# Patient Record
Sex: Female | Born: 1970 | Race: Black or African American | Hispanic: No | Marital: Married | Smoking: Never smoker
Health system: Southern US, Community
[De-identification: ages and names within clinical notes are randomized; demographics above are authoritative.]

## PROBLEM LIST (undated history)

## (undated) DIAGNOSIS — K219 Gastro-esophageal reflux disease without esophagitis: Secondary | ICD-10-CM

## (undated) DIAGNOSIS — K921 Melena: Secondary | ICD-10-CM

## (undated) DIAGNOSIS — M1990 Unspecified osteoarthritis, unspecified site: Secondary | ICD-10-CM

---

## 1999-02-25 ENCOUNTER — Other Ambulatory Visit: Admission: RE | Admit: 1999-02-25 | Discharge: 1999-02-25 | Payer: Self-pay | Admitting: *Deleted

## 2001-11-01 ENCOUNTER — Other Ambulatory Visit: Admission: RE | Admit: 2001-11-01 | Discharge: 2001-11-01 | Payer: Self-pay | Admitting: Obstetrics and Gynecology

## 2002-04-03 ENCOUNTER — Encounter (INDEPENDENT_AMBULATORY_CARE_PROVIDER_SITE_OTHER): Payer: Self-pay | Admitting: Specialist

## 2002-04-03 ENCOUNTER — Inpatient Hospital Stay (HOSPITAL_COMMUNITY): Admission: AD | Admit: 2002-04-03 | Discharge: 2002-04-08 | Payer: Self-pay | Admitting: Obstetrics and Gynecology

## 2002-05-13 ENCOUNTER — Other Ambulatory Visit: Admission: RE | Admit: 2002-05-13 | Discharge: 2002-05-13 | Payer: Self-pay | Admitting: Obstetrics and Gynecology

## 2003-06-06 ENCOUNTER — Other Ambulatory Visit: Admission: RE | Admit: 2003-06-06 | Discharge: 2003-06-06 | Payer: Self-pay | Admitting: Obstetrics and Gynecology

## 2005-07-13 ENCOUNTER — Encounter: Admission: RE | Admit: 2005-07-13 | Discharge: 2005-07-13 | Payer: Self-pay | Admitting: Family Medicine

## 2006-11-15 ENCOUNTER — Inpatient Hospital Stay (HOSPITAL_COMMUNITY): Admission: AD | Admit: 2006-11-15 | Discharge: 2006-11-18 | Payer: Self-pay | Admitting: Obstetrics and Gynecology

## 2009-01-24 ENCOUNTER — Emergency Department (HOSPITAL_COMMUNITY): Admission: EM | Admit: 2009-01-24 | Discharge: 2009-01-24 | Payer: Self-pay | Admitting: Family Medicine

## 2011-01-03 ENCOUNTER — Other Ambulatory Visit: Payer: Self-pay | Admitting: Obstetrics and Gynecology

## 2011-01-03 DIAGNOSIS — Z1231 Encounter for screening mammogram for malignant neoplasm of breast: Secondary | ICD-10-CM

## 2011-01-11 ENCOUNTER — Ambulatory Visit
Admission: RE | Admit: 2011-01-11 | Discharge: 2011-01-11 | Disposition: A | Discharge status: Home / Self Care | Payer: Managed Care, Other (non HMO) | Source: Ambulatory Visit | Attending: Obstetrics and Gynecology | Admitting: Obstetrics and Gynecology

## 2011-01-11 DIAGNOSIS — Z1231 Encounter for screening mammogram for malignant neoplasm of breast: Secondary | ICD-10-CM

## 2011-01-26 LAB — POCT I-STAT, CHEM 8
BUN: 3 mg/dL — ABNORMAL LOW (ref 6–23)
Calcium, Ion: 1.1 mmol/L — ABNORMAL LOW (ref 1.12–1.32)
Hemoglobin: 13.6 g/dL (ref 12.0–15.0)
Sodium: 140 mEq/L (ref 135–145)
TCO2: 28 mmol/L (ref 0–100)

## 2011-03-04 NOTE — Discharge Summary (Signed)
Katrina Oconnor, Katrina Oconnor                  ACCOUNT NO.:  [id number]   MEDICAL RECORD NO.:  [id number]          PATIENT TYPE:  INP   LOCATION:  9142                          FACILITY:  [facility]   PHYSICIAN:  Maxie Better, M.D.DATE OF BIRTH:  1971/04/23   DATE OF ADMISSION:  11/15/2006  DATE OF DISCHARGE:  11/18/2006                               DISCHARGE SUMMARY   ADMISSION DIAGNOSES:  1. Previous cesarean section.  2. Term gestation.   DIAGNOSES:  1. Term gestation delivered.  2. Previous cesarean section.   PROCEDURES:  1. Repeat cesarean section.  2. Extensive lysis of adhesions.   HOSPITAL COURSE:  The patient was admitted to [facility] She was  taken to the operating room, where she underwent a repeat cesarean  section and extensive lysis of adhesions.  The procedure resulted in  delivery of a live female; 7 pounds and Apgars of 8 and 9.  The patient  had an uncomplicated postoperative course.  By postoperative day #3 she  had a bowel movement, passed flatus and tolerating a regular diet.  Her  incision had no erythema, induration or exudate.  She was deemed well to  be discharged home.   Her CBC on postoperative day #1 showed a hemoglobin 9.6, hematocrit 28,  white count 8.9, platelet count 179,000.   DISPOSITION:  Home.   DISCHARGE CONDITION:  Stable.   DISCHARGE MEDICATIONS:  1. Prenatal vitamins one p.o. daily.  2. Percocet 1-2 tablets q.4-6 h p.r.n. pain.  3. Niferex Forte one p.o. daily.  4. Ibuprofen 800 mg q.8 h p.r.n. pain.   DISCHARGE INSTRUCTIONS:  Per the postpartum booklet given.  Discharge  follow-up appointment at [facility] on November 22, 2006 with staple  removal, and then 6 weeks postpartum.     Maxie Better, M.D.  Electronically Signed    [place]/MEDQ  D:  12/10/2006  T:  12/10/2006  Job:  [id number]

## 2011-03-04 NOTE — H&P (Signed)
[facility]  Patient:    Katrina Oconnor, Katrina Oconnor Visit Number: [id number] MRN: [id number]          Service Type: OBS Location: 910A 9112 01 Attending Physician:  Maxie Better Dictated by:   Sheria Lang. Cherly Hensen, M.D. Admit Date:  04/03/2002                           History and Physical  DATE OF BIRTH:                1971/05/13  CHIEF COMPLAINT:              Oligohydramnios, induction of labor.  HISTORY OF PRESENT ILLNESS:   This is a 41 year old gravida 1 para 0 married black female with an EDC of April 11, 2002 by ultrasound who is now at 42 and six-sevenths weeks gestation admitted for cervical ripening and induction of labor secondary to oligohydramnios.  The patient underwent an ultrasound for size greater than dates which revealed estimated fetal weight of 7 pounds 12 ounces and an amniotic fluid index of 5 which is at the second percentile. The patient has an unfavorable cervix.  Her cervix was long, closed, and presenting part -3.  The patient denies any contractions.  Her prenatal course has been uncomplicated.  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  Prenatal care is at [facility], primary obstetrician Dr. Maxie Better.  PRENATAL LABORATORY DATA:     Blood type O positive, antibody screen negative. Hemoglobin electrophoresis is normal.  RPR is nonreactive.  Rubella is equivocal.  Hepatitis B surface antigen is negative.  HIV test was negative. GC and chlamydia cultures were negative.  AFP3 test was normal.  Ultrasound on October 22, 2001 showed 15.4 weeks with her EDC changed to April 11, 2002.  The patient had a normal anatomic fetal survey on December 12, 2001 at which time she was 23.2 weeks.  One-hour GCT was normal.  Hemoglobin at that time was 10.1 - this was on January 09, 2002.  Group B strep culture  was negative.  PAST MEDICAL HISTORY:  ALLERGIES:                    No known drug allergies.  MEDICATIONS:                  Iron supplements, prenatal vitamins, Valtrex.  MEDICAL HISTORY:              Childhood asthma.  SURGERY:                      None.  FAMILY HISTORY:               Hypertension - mother and maternal grandmother.  SOCIAL HISTORY:               Married, nonsmoker.  Works as a Engineer, mining at [employer].  REVIEW OF SYSTEMS:            Negative except as noted in the history of present illness.  PHYSICAL EXAMINATION:  GENERAL:                      Anxious, gravid black female.  VITAL SIGNS:  Blood pressure 118/80, fetal heart rate 152, weight 182 pounds.  SKIN:                         Shows no lesions.  HEENT:                        Anicteric sclerae, pale conjunctivae. Oropharynx negative.  HEART:                        Regular rate and rhythm without murmur.  LUNGS:                        Clear to auscultation.  BREASTS:                      Soft, pedunculated, no palpable mass.  ABDOMEN:                      Gravid.  Fundal height 42 cm.  PELVIC:                       Cervix was long, closed, presenting part high.  EXTREMITIES:                  Show 1-2+ edema.  IMPRESSION:                   1. Term gestation.                               2. Oligohydramnios.                               3. History of herpes simplex virus without any                                  recent outbreak.                               4. Rubella equivocal.  PLAN:                         1. Admission.                               2. Routine labs.                               3. Cervical ripening with Cervidil.                               4. Pitocin induction on April 04, 2002.                               5. Rubella vaccine postpartum. Dictated by:   Sheria Lang. Cherly Hensen, M.D. Attending Physician:  Maxie Better DD:  04/03/02 TD:   04/04/02 Job: [id number]

## 2011-03-04 NOTE — Discharge Summary (Signed)
[facility]  Patient:    Katrina Oconnor, Katrina Oconnor Visit Number: [id number] MRN: [id number]          Service Type: OBS Location: 910A 9112 01 Attending Physician:  Maxie Better Dictated by:   Sheria Lang. Cherly Hensen, M.D. Admit Date:  04/03/2002 Discharge Date: 04/08/2002                             Discharge Summary  ADMISSION DIAGNOSES:          1. Oligohydramnios.                               2. Term gestation.                               3. Rubella nonimmune.  DISCHARGE DIAGNOSES:          1. Term gestation, delivered.                               2. Arrest of dilatation.                               3. Mild chorioamnionitis.                               4. Postoperative anemia.                               5. Status post primary cesarean section.  PROCEDURES:                   Primary cesarean section.  HISTORY OF PRESENT ILLNESS:   This is a 40 year old gravida 1, para 0 female at 38+ weeks gestation, admitted for cervical ripening secondary to severe oligohydramnios.  The patient had an uncomfortable prenatal course.  HOSPITAL COURSE:              The patient was admitted.  She was placed on a monitor.  She had a reactive non-stress test.  Cervidil was placed for ripening.  She subsequently had Pitocin induction and artificial rupture of membranes.  Her group B strep culture was negative.  She received an epidural in active labor.  Intrauterine pressure catheter and internal fetal scalp electrodes were placed.  The patient progressed to 6 cm, and ______ and anterior lip that was edematous, +1 station with overriding palpable sutures. She remained unchanged at that cervical dilatation.  She spiked a temperature to 101.9 axillary.  Presumptive diagnosis of chorioamnionitis was made.  She was started on gentamicin and clindamycin.  At that point she had been ruptured of membranes for 17 hours.  The decision was made to proceed with cesarean  section.  She underwent a primary low transverse uterine incision with resultant delivery of a live female, 7 pounds 5 ounces, Apgars of 8 and 9. She was continued on her antibiotics postoperatively.  After being afebrile for at least 48 hours, the patients antibiotics were discontinued.  She was tolerating a regular diet, passing flatus.  By postoperative day #3 she was deemed well enough to be discharged.  Her incision had no erythema,  induration, or exudate.  Her CBC on postoperative day #1 showed a hemoglobin of 8.9, white count of 18.5, platelet count of 151,000.  She was given rubella vaccine postpartum.  She was discharged home.  DISPOSITION:                  Home.  CONDITION ON DISCHARGE:       Stable.  DISCHARGE MEDICATIONS:        1. Tylox #30, 1 p.o. q.6-8h. p.r.n. pain.                               2. Motrin 600 mg 1 p.o. q.6h. p.r.n. pain.                               3. Prenatal vitamins 1 p.o. q.d.                               4. Over-the-counter iron supplementation 1 p.o.                                  b.i.d.  FOLLOW-UP:                    Follow-up appointment at four weeks postpartum.  DISCHARGE INSTRUCTIONS:       Call for temperature greater than or equal to 100.4.  Nothing per vagina for four to six weeks.  Call if severe abdominal pain, nausea, vomiting, or incisional pain, redness, or drainage from the incision site, passage of clots, soaking a regular pad every hour or more frequently. Dictated by:   Sheria Lang. Cherly Hensen, M.D. Attending Physician:  Maxie Better DD:  05/01/02 TD:  05/06/02 Job: [id number]

## 2011-03-04 NOTE — Op Note (Signed)
[facility]  Patient:    Katrina Oconnor, Katrina Oconnor Visit Number: [id number] MRN: [id number]          Service Type: OBS Location: 910A 9112 01 Attending Physician:  Maxie Better Dictated by:   Sheria Lang. Cherly Hensen, M.D. Proc. Date: 04/05/02 Admit Date:  04/03/2002                             Operative Report  PREOPERATIVE DIAGNOSES:       1. Arrest of dilatation.                               2. Presumed chorionitis.                               3. Severe oligohydramnios.  POSTOPERATIVE DIAGNOSES:      1. Arrest of dilatation.                               2. Presumed chorionitis.  OPERATION:                    Primary cesarean section.  SURGEON:                      Sheronette A. Cherly Hensen, M.D.  ANESTHESIA:                   Epidural.  INDICATIONS:                  This is a 40 year old gravida 1, para 0 female at 38+ weeks gestation who had been admitted on April 03, 2002, secondary to severe oligohydramnios and for induction of labor. The patient underwent cervical ripening with Cervidil followed by Pitocin and she subsequently had artificial rupture of membranes, clear fluid noted on the following day. Intrauterine pressure catheter was placed. Amnioinfusion was prophylactically done. Her group B strep culture was negative. Pitocin induction was continued. The patient progressed to 6 cm, +1 station, with overriding sutures noted and an anterior swollen cervix and despite continued Pitocin over a 74-hour period of time, cervical exam remained unchanged. The decision was, therefore, made to proceed with a primary cesarean section. At that point, her temperature was taken and it was noted to be 101.9 axillary and she was started on gentamicin clindamycin for presumed chorionitis. Her prenatal course had otherwise been unremarkable. Risks and benefits of procedure had been explained to the patient and her family, consent was obtained and signed, and  the patient was transferred to the operating room.  DESCRIPTION OF PROCEDURE:     Under adequate epidural anesthesia, the patient was placed in the supine position with left lateral tilt. Indwelling Foley catheter had been in place previously. She was sterilely prepped and draped in the usual fashion. A marking pen was utilized to outline the planned incision. Marcaine 0.25% 8 cc was injected along the planned incision line. A scalpel was then used to make the Pfannenstiel incision. Cautery was then utilized to carry it down to the rectus fascia. The rectus fascia was incised in the midline and extended bilaterally. The rectus fascia was then bluntly and with cautery dissected down to the rectus muscle in superior and inferior fashion. The rectus muscles split in the midline. The  parietal peritoneum was entered bluntly and extended. The vesicouterine peritoneum was then created. The bladder, which was loosely attached to the lower uterine segment, was bluntly dissected and displaced from the operative field using a bladder retractor. A curvilinear low transverse uterine incision was then made and carried bilaterally using bandage scissors. A copious amount of fluid was then noted from the amnioinfusion fluid. The baby was a live female who was delivered from a left occiput anterior position with a large caput. The baby was bulb suctioned in the abdomen, delivered, cord was clamped and cut. The baby was transferred to the awaiting pediatricians with sound Apgars of 8 and 9. The placenta was spontaneous, intact, and sent to pathology. The uterine cavity was cleaned of debris. The uterine incision was inspected, no extension noted. The uterine incision was closed. First layer with 0 Monocryl running-locked stitch. Second layer is imbricating using 0 Monocryl. The bleeding site on the right inferior aspect of the lower uterine segment was hemostased with 0 Vicryl figure-of-eight suture. Small  bleeding along the peritoneal edges were cauterized. The abdomen was then copiously irrigated. The paracolic gutters were cleaned of debris. Reinspection of the incision site showed good hemostasis. Normal tubes and ovaries were noted bilaterally. The vesicouterine peritoneum and the parietal peritoneum were now closed. Down the surface of the rectus fascia was inspected. No bleeding was noted. The rectus fascia was closed with 0 Vicryl x2. The subcuticular area was irrigated, small bleeders cauterized, and the skin approximated using Ethicon staples. Specimens, placenta sent to pathology. Fetal weight was 7 pounds 5 ounces. Estimated blood loss was 800 cc. Intraoperative fluid was three liters. Urine output was 50 cc of concentrated urine. Sponge, instrument, and needle counts x2 were correct. Complications none. The patient tolerated the procedure well, was transferred to the recovery room in stable condition. Dictated by:   Sheria Lang. Cherly Hensen, M.D. Attending Physician:  Maxie Better DD:  04/05/02 TD:  04/06/02 Job: [id number]

## 2011-03-04 NOTE — Op Note (Signed)
Katrina Oconnor, Katrina Oconnor                  ACCOUNT NO.:  [id number]   MEDICAL RECORD NO.:  [id number]          PATIENT TYPE:  INP   LOCATION:  9142                          FACILITY:  [facility]   PHYSICIAN:  Maxie Better, M.D.DATE OF BIRTH:  11/26/1970   DATE OF PROCEDURE:  11/15/2006  DATE OF DISCHARGE:                               OPERATIVE REPORT   PREOPERATIVE DIAGNOSES:  1. Previous cesarean section.  2. Term gestation.   PROCEDURES:  1. Repeat cesarean section, Kerr hysterotomy.  2. Extensive lysis of adhesions.   POSTOPERATIVE DIAGNOSES:  1. Previous cesarean section.  2. Term gestation.  3. Abdominal-pelvic adhesions, extensive.   ANESTHESIA:  Spinal.   SURGEON:  Maxie Better, M.D.   ASSISTANT:  Marlinda Mike, C.N.M.   PROCEDURE:  Under adequate spinal anesthesia, the patient was placed in  a supine position with a left lateral tilt.  She was sterilely prepped  and draped in the usual fashion.  Indwelling Foley catheter was  sterilely placed.  Marcaine 0.25% was injected along the previous  Pfannenstiel skin incision.  A Pfannenstiel skin incision was then made,  carried down to the rectus fascia using cautery.  The rectus fascia was  opened transversely.  The rectus fascia inferiorly was bluntly and  sharply dissected off the rectus muscle superiorly.  There were marked  adhesions and careful dissection was then performed.  The parietal  peritoneum was entered and on entering the parietal peritoneum, omentum  was noted.  Ultimately the parietal peritoneum was opened; however,  there were still adhesions of the omentum to the anterior abdominal  wall.  Part of that was lysed in order to facilitate the surgery.  Finally the vesicouterine peritoneum was visualized.  The bladder was  adherent to the lower uterine segment.  A transverse incision was then  made.  The attempt at dissection of the bladder inferiorly was not  successful.  A curvilinear low transverse  uterine incision was made and  a marked amount of bleeding was then noted from vessels.  Ultimately the  incision was able to be opened transversely.  The artificial rupture of  membranes.  Copious fluid was noted.  Subsequent delivery of a live  female using the vacuum  was performed.  The baby was bulb suctioned on  the abdomen.  Cord was clamped, cut.  The baby was transferred to the  awaiting pediatricians, who assigned Apgars of 8 and 9 at one and five  minutes.  The placenta was spontaneous, intact and was sent to  pathology.  The uterine cavity was cleaned of debris.  The uterine  incision had no extension, but there were still vessels that were  bleeding.  The uterine incision was closed in two layers, the first  layer of 0 Monocryl running locked stitch, second layer was imbricated  using 0 Monocryl suture.   At that time attention was then turned to the adhesions, which was the  omentum attached to the anterior abdominal wall and to the upper aspect  of the midportion of the uterus.  These adhesions were then carefully  lysed.  It appeared that the omentum was looped on itself, which needed  to be taken down in order to prevent any bowel obstructions in the  future.  After marked extensive adhesion release of the omentum off the  anterior abdominal wall, the end of the omentum was ratty and bleeding.  This resulted in a piece of those areas of the omentum to be clamped,  cut and free tied with 0 Vicryl and the remaining pieces removed.  The  upper abdomen was then finally able to be seen.  The tubes and ovaries  were noted to be normal.  Copious irrigation of the abdomen was then  performed, small bleeders again cauterized.  When good hemostasis  finally noted, Interceed was placed over the raw surface of the anterior  aspect of the uterus.  The parietal peritoneum was not closed.  Bleeding  on the rectus muscle was cauterized.  The rectus fascia was then closed  with 0 Vicryl  x2.  The subcutaneous area had bleeders, which was also  cauterized, and the due to the scarring was undermined with the cautery.  The subcutaneous area was irrigated, suctioned, small bleeders  cauterized.  Interrupted 2-0 plain sutures were then placed.  The skin  after much attempt was finally able to be closed with staples.   SPECIMENS:  Placenta, not sent to pathology.   ESTIMATED BLOOD LOSS:  1000 mL.   INTRAOPERATIVE FLUID:  4200 mL crystalloid.   URINE OUTPUT:  200 mL concentrated urine.   SPONGE AND SENT COUNTS X2:  Correct.   COMPLICATION:  None.   Weight of the baby was 7 pounds.  The patient tolerated the procedure  well, was transferred to recovery in stable condition.      Maxie Better, M.D.  Electronically Signed     [place]/MEDQ  D:  11/15/2006  T:  11/15/2006  Job:  [id number]

## 2011-12-21 ENCOUNTER — Other Ambulatory Visit: Payer: Self-pay | Admitting: Obstetrics and Gynecology

## 2011-12-21 DIAGNOSIS — Z1231 Encounter for screening mammogram for malignant neoplasm of breast: Secondary | ICD-10-CM

## 2012-01-24 ENCOUNTER — Ambulatory Visit
Admission: RE | Admit: 2012-01-24 | Discharge: 2012-01-24 | Disposition: A | Discharge status: Home / Self Care | Payer: Managed Care, Other (non HMO) | Source: Ambulatory Visit | Attending: Obstetrics and Gynecology | Admitting: Obstetrics and Gynecology

## 2012-01-24 DIAGNOSIS — Z1231 Encounter for screening mammogram for malignant neoplasm of breast: Secondary | ICD-10-CM

## 2013-01-16 ENCOUNTER — Other Ambulatory Visit: Payer: Self-pay

## 2013-01-16 DIAGNOSIS — Z1231 Encounter for screening mammogram for malignant neoplasm of breast: Secondary | ICD-10-CM

## 2013-02-25 ENCOUNTER — Ambulatory Visit
Admission: RE | Admit: 2013-02-25 | Discharge: 2013-02-25 | Disposition: A | Discharge status: Home / Self Care | Payer: Commercial Indemnity | Source: Ambulatory Visit

## 2013-02-25 DIAGNOSIS — Z1231 Encounter for screening mammogram for malignant neoplasm of breast: Secondary | ICD-10-CM

## 2013-12-11 ENCOUNTER — Emergency Department (HOSPITAL_COMMUNITY): Payer: Managed Care, Other (non HMO)

## 2013-12-11 ENCOUNTER — Emergency Department (HOSPITAL_COMMUNITY)
Admission: EM | Admit: 2013-12-11 | Discharge: 2013-12-11 | Disposition: A | Discharge status: Home / Self Care | Payer: Managed Care, Other (non HMO) | Attending: Emergency Medicine | Admitting: Emergency Medicine

## 2013-12-11 ENCOUNTER — Encounter (HOSPITAL_COMMUNITY): Payer: Self-pay | Admitting: Emergency Medicine

## 2013-12-11 DIAGNOSIS — M5430 Sciatica, unspecified side: Secondary | ICD-10-CM | POA: Insufficient documentation

## 2013-12-11 DIAGNOSIS — Z880 Allergy status to penicillin: Secondary | ICD-10-CM | POA: Insufficient documentation

## 2013-12-11 DIAGNOSIS — Z79899 Other long term (current) drug therapy: Secondary | ICD-10-CM | POA: Insufficient documentation

## 2013-12-11 DIAGNOSIS — R55 Syncope and collapse: Secondary | ICD-10-CM | POA: Insufficient documentation

## 2013-12-11 DIAGNOSIS — IMO0002 Reserved for concepts with insufficient information to code with codable children: Secondary | ICD-10-CM | POA: Insufficient documentation

## 2013-12-11 DIAGNOSIS — M5432 Sciatica, left side: Secondary | ICD-10-CM

## 2013-12-11 MED ORDER — DIAZEPAM 5 MG PO TABS
5.0000 mg | ORAL_TABLET | Freq: Once | ORAL | Status: AC
  Administered 2013-12-11: 5 mg via ORAL
  Filled 2013-12-11: qty 1

## 2013-12-11 MED ORDER — DIAZEPAM 5 MG PO TABS: 5.0000 mg | ORAL_TABLET | Freq: Two times a day (BID) | ORAL | Status: DC

## 2013-12-11 MED ORDER — PREDNISONE 20 MG PO TABS: ORAL_TABLET | ORAL | Status: DC

## 2013-12-11 MED ORDER — OXYCODONE-ACETAMINOPHEN 5-325 MG PO TABS
2.0000 | ORAL_TABLET | Freq: Once | ORAL | Status: AC
  Administered 2013-12-11: 2 via ORAL
  Filled 2013-12-11: qty 2

## 2013-12-11 MED ORDER — KETOROLAC TROMETHAMINE 60 MG/2ML IM SOLN: 60.0000 mg | Freq: Once | INTRAMUSCULAR | Status: DC

## 2013-12-11 MED ORDER — HYDROCODONE-ACETAMINOPHEN 5-325 MG PO TABS: 1.0000 | ORAL_TABLET | ORAL | Status: DC | PRN

## 2013-12-11 NOTE — Discharge Instructions (Signed)
Take prednisone as prescribed. Take Valium as needed as directed for muscle spasm. No driving or operating heavy machinery while taking valium. This medication may cause drowsiness. Take Vicodin for severe pain only. No driving or operating heavy machinery while taking vicodin. This medication may cause drowsiness. Follow up with your orthopedist.  Sciatica Sciatica is pain, weakness, numbness, or tingling along the path of the sciatic nerve. The nerve starts in the lower back and runs down the back of each leg. The nerve controls the muscles in the lower leg and in the back of the knee, while also providing sensation to the back of the thigh, lower leg, and the sole of your foot. Sciatica is a symptom of another medical condition. For instance, nerve damage or certain conditions, such as a herniated disk or bone spur on the spine, pinch or put pressure on the sciatic nerve. This causes the pain, weakness, or other sensations normally associated with sciatica. Generally, sciatica only affects one side of the body. CAUSES   Herniated or slipped disc.  Degenerative disk disease.  A pain disorder involving the narrow muscle in the buttocks (piriformis syndrome).  Pelvic injury or fracture.  Pregnancy.  Tumor (rare). SYMPTOMS  Symptoms can vary from mild to very severe. The symptoms usually travel from the low back to the buttocks and down the back of the leg. Symptoms can include:  Mild tingling or dull aches in the lower back, leg, or hip.  Numbness in the back of the calf or sole of the foot.  Burning sensations in the lower back, leg, or hip.  Sharp pains in the lower back, leg, or hip.  Leg weakness.  Severe back pain inhibiting movement. These symptoms may get worse with coughing, sneezing, laughing, or prolonged sitting or standing. Also, being overweight may worsen symptoms. DIAGNOSIS  Your caregiver will perform a physical exam to look for common symptoms of sciatica. He or  she may ask you to do certain movements or activities that would trigger sciatic nerve pain. Other tests may be performed to find the cause of the sciatica. These may include:  Blood tests.  X-rays.  Imaging tests, such as an MRI or CT scan. TREATMENT  Treatment is directed at the cause of the sciatic pain. Sometimes, treatment is not necessary and the pain and discomfort goes away on its own. If treatment is needed, your caregiver may suggest:  Over-the-counter medicines to relieve pain.  Prescription medicines, such as anti-inflammatory medicine, muscle relaxants, or narcotics.  Applying heat or ice to the painful area.  Steroid injections to lessen pain, irritation, and inflammation around the nerve.  Reducing activity during periods of pain.  Exercising and stretching to strengthen your abdomen and improve flexibility of your spine. Your caregiver may suggest losing weight if the extra weight makes the back pain worse.  Physical therapy.  Surgery to eliminate what is pressing or pinching the nerve, such as a bone spur or part of a herniated disk. HOME CARE INSTRUCTIONS   Only take over-the-counter or prescription medicines for pain or discomfort as directed by your caregiver.  Apply ice to the affected area for 20 minutes, 3 4 times a day for the first 48 72 hours. Then try heat in the same way.  Exercise, stretch, or perform your usual activities if these do not aggravate your pain.  Attend physical therapy sessions as directed by your caregiver.  Keep all follow-up appointments as directed by your caregiver.  Do not wear high heels  or shoes that do not provide proper support.  Check your mattress to see if it is too soft. A firm mattress may lessen your pain and discomfort. SEEK IMMEDIATE MEDICAL CARE IF:   You lose control of your bowel or bladder (incontinence).  You have increasing weakness in the lower back, pelvis, buttocks, or legs.  You have redness or  swelling of your back.  You have a burning sensation when you urinate.  You have pain that gets worse when you lie down or awakens you at night.  Your pain is worse than you have experienced in the past.  Your pain is lasting longer than 4 weeks.  You are suddenly losing weight without reason. MAKE SURE YOU:  Understand these instructions.  Will watch your condition.  Will get help right away if you are not doing well or get worse. Document Released: 09/27/2001 Document Revised: 04/03/2012 Document Reviewed: 02/12/2012 [facility] Patient Information 2014 [employer] [place].  Radicular Pain Radicular pain in either the arm or leg is usually from a bulging or herniated disk in the spine. A piece of the herniated disk may press against the nerves as the nerves exit the spine. This causes pain which is felt at the tips of the nerves down the arm or leg. Other causes of radicular pain may include:  Fractures.  Heart disease.  Cancer.  An abnormal and usually degenerative state of the nervous system or nerves (neuropathy). Diagnosis may require CT or MRI scanning to determine the primary cause.  Nerves that start at the neck (nerve roots) may cause radicular pain in the outer shoulder and arm. It can spread down to the thumb and fingers. The symptoms vary depending on which nerve root has been affected. In most cases radicular pain improves with conservative treatment. Neck problems may require physical therapy, a neck collar, or cervical traction. Treatment may take many weeks, and surgery may be considered if the symptoms do not improve.  Conservative treatment is also recommended for sciatica. Sciatica causes pain to radiate from the lower back or buttock area down the leg into the foot. Often there is a history of back problems. Most patients with sciatica are better after 2 to 4 weeks of rest and other supportive care. Short term bed rest can reduce the disk pressure considerably. Sitting,  however, is not a good position since this increases the pressure on the disk. You should avoid bending, lifting, and all other activities which make the problem worse. Traction can be used in severe cases. Surgery is usually reserved for patients who do not improve within the first months of treatment. Only take over-the-counter or prescription medicines for pain, discomfort, or fever as directed by your caregiver. Narcotics and muscle relaxants may help by relieving more severe pain and spasm and by providing mild sedation. Cold or massage can give significant relief. Spinal manipulation is not recommended. It can increase the degree of disc protrusion. Epidural steroid injections are often effective treatment for radicular pain. These injections deliver medicine to the spinal nerve in the space between the protective covering of the spinal cord and back bones (vertebrae). Your caregiver can give you more information about steroid injections. These injections are most effective when given within two weeks of the onset of pain.  You should see your caregiver for follow up care as recommended. A program for neck and back injury rehabilitation with stretching and strengthening exercises is an important part of management.  SEEK IMMEDIATE MEDICAL CARE IF:  You  develop increased pain, weakness, or numbness in your arm or leg.  You develop difficulty with bladder or bowel control.  You develop abdominal pain. Document Released: 11/10/2004 Document Revised: 12/26/2011 Document Reviewed: 01/26/2009 [facility] Patient Information 2014 [place], [place].

## 2013-12-11 NOTE — ED Notes (Signed)
Per EMS pt coming from chiropractor's office where she had "electrotherapy" for her right hip pain. Afterwards per EMS pt had "short syncopal episode" and was screaming in pain. Pt reports pain 10/10. Per EMS pt refused IV line.

## 2013-12-11 NOTE — ED Notes (Signed)
Bed: WA13 Expected date:  Expected time:  Means of arrival:  Comments: EMS syncope 

## 2013-12-11 NOTE — Progress Notes (Signed)
   CARE MANAGEMENT ED NOTE 12/11/2013  Patient:  Katrina Oconnor,Katrina Oconnor   Account Number:  [id number]  Date Initiated:  12/11/2013  Documentation initiated by:  Radford PaxFERRERO,Massey Ruhland  Subjective/Objective Assessment:   Patient presents to ED with syncopal episode.     Subjective/Objective Assessment Detail:   Patient with pmhx of sciatica pain.     Action/Plan:   Action/Plan Detail:   Anticipated DC Date:       Status Recommendation to Physician:   Result of Recommendation:    Other ED Services  Consult Working Plan    DC Planning Services  Other  PCP issues    Choice offered to / List presented to:            Status of service:  Completed, signed off  ED Comments:   ED Comments Detail:  Patient confirms she does not have a pcp.  Patient reports she sees Dr. Allyn KennerSharonette [facility].  EDCM instructed patient to call phone number on the back of her insurance card or go to insurance company website to help her find a physician who is close to him and within network.  Patient thankful for information.  No further EDCM needs at this time.

## 2013-12-11 NOTE — ED Provider Notes (Signed)
CSN: [id number]     Arrival date & time 12/11/13  1320 History   First MD Initiated Contact with Patient 12/11/13 1506     Chief Complaint  Patient presents with  . Hip Pain  . syncopal episode      (Consider location/radiation/quality/duration/timing/severity/associated sxs/prior Treatment) HPI Comments: Patient is a 43 year old female with a past medical history of sciatica who presents to the emergency department complaining of for sciatica x3 weeks, worsening earlier today while at the chiropractors office. Patient states the chiropractor was "doing something on her lower back" both moving it and electrotherapy, she went to stand up and developed severe low back pain radiating down both hips, left worse than right, causing her to have a "short" syncopal episode. The workers at the chiropractic office were holding her the entire time. Pain rated 10/10, worse when sitting up, relieved by laying down. In the past she has tried using ibuprofen without relief. Denies numbness or tingling, loss of control bowels or bladder or saddle anesthesia. Denies fever, chills or night sweats, no history of IV drug abuse. Pt reports no injury or trauma to her back or hip.  Patient is a 43 y.o. female presenting with hip pain. The history is provided by the patient and the spouse.  Hip Pain    History reviewed. No pertinent past medical history. Past Surgical History  Procedure Laterality Date  . Cesarean section     No family history on file. History  Substance Use Topics  . Smoking status: Never Smoker   . Smokeless tobacco: Not on file  . Alcohol Use: No   OB History   Grav Para Term Preterm Abortions TAB SAB Ect Mult Living                 Review of Systems  Musculoskeletal: Positive for back pain.       Positive for left hip pain.  All other systems reviewed and are negative.      Allergies  Penicillins  Home Medications   Current Outpatient Rx  Name  Route  Sig  Dispense   Refill  . ibuprofen (ADVIL,MOTRIN) 200 MG tablet   Oral   Take 400 mg by mouth every 6 (six) hours as needed for moderate pain.         . diazepam (VALIUM) 5 MG tablet   Oral   Take 1 tablet (5 mg total) by mouth 2 (two) times daily.   10 tablet   0   . HYDROcodone-acetaminophen (NORCO/VICODIN) 5-325 MG per tablet   Oral   Take 1-2 tablets by mouth every 4 (four) hours as needed.   10 tablet   0   . predniSONE (DELTASONE) 20 MG tablet      2 tabs po daily x 4 days   8 tablet   0    BP 116/65  Pulse 96  Temp(Src) 98.1 F (36.7 C) (Oral)  Resp 14  SpO2 96%  LMP 11/27/2013 Physical Exam  Nursing note and vitals reviewed. Constitutional: She is oriented to person, place, and time. She appears well-developed and well-nourished. No distress.  uncomfortable  HENT:  Head: Normocephalic and atraumatic.  Mouth/Throat: Oropharynx is clear and moist.  Eyes: Conjunctivae and EOM are normal.  Neck: Normal range of motion. Neck supple.  Cardiovascular: Normal rate, regular rhythm, normal heart sounds and intact distal pulses.   Pulmonary/Chest: Effort normal and breath sounds normal.  Abdominal: Soft. Bowel sounds are normal. There is no tenderness.  Musculoskeletal: Normal  range of motion. She exhibits no edema.       Thoracic back: She exhibits no tenderness, no bony tenderness, no swelling and no edema.       Lumbar back: She exhibits tenderness. She exhibits no bony tenderness, no edema and no deformity.  TTP across lower back, left buttock and left lateral hip. ROM L hip limited by pain.  Neurological: She is alert and oriented to person, place, and time. She has normal strength and normal reflexes. No sensory deficit.  Strength LE 5/5 and equal BL.  Skin: Skin is warm and dry. No rash noted. She is not diaphoretic. No cyanosis.  Psychiatric: She has a normal mood and affect. Her behavior is normal.    ED Course  Procedures (including critical care time) Labs  Review Labs Reviewed - No data to display Imaging Review Dg Lumbar Spine Complete  12/11/2013   CLINICAL DATA:  Back pain  EXAM: LUMBAR SPINE - COMPLETE 4+ VIEW  COMPARISON:  None.  FINDINGS: Anatomic alignment. No vertebral compression deformity. Minimal narrowing of the L4-5 disc. No definite acute fracture. Mild degenerative change of the SI joints.  IMPRESSION: No acute bony pathology.   Electronically Signed   By: Maryclare BeanArt  Hoss M.D.   On: 12/11/2013 16:12   Dg Hip Complete Left  12/11/2013   CLINICAL DATA:  Low back pain and left hip pain.  EXAM: LEFT HIP - COMPLETE 2+ VIEW  COMPARISON:  None.  FINDINGS: No acute fracture. No dislocation. Mild degenerative change of the SI joints.  IMPRESSION: No acute bony pathology.   Electronically Signed   By: Maryclare BeanArt  Hoss M.D.   On: 12/11/2013 16:11    EKG Interpretation   None       MDM   Final diagnoses:  Sciatica of left side   Patient presenting with severe low back pain radiating down to her hips, left worse than right after chiropractic treatment. No red flags concerning patient's back pain. No s/s of central cord compression or cauda equina. Lower extremities are neurovascularly intact. No associated injury. X-rays of lumbar spine and hip without acute findings. After receiving Valium and Vicodin patient is able to ambulate normally. Stable for discharge, short course of prednisone, Valium and Vicodin. She will followup with her orthopedist. Return precautions given. Patient states understanding of treatment care plan and is agreeable.      Trevor MaceRobyn M Albert, PA-C 12/11/13 [phone]

## 2013-12-11 NOTE — ED Notes (Signed)
Attempted to check orthostatic vital signs, patient sts she can not move at this time.

## 2013-12-11 NOTE — ED Notes (Signed)
Patient refuses IV placement. 

## 2013-12-12 NOTE — ED Provider Notes (Signed)
Medical screening examination/treatment/procedure(s) were performed by non-physician practitioner and as supervising physician I was immediately available for consultation/collaboration.  EKG Interpretation   None         Saphire Barnhart S Cordero Surette, MD 12/12/13 0032 

## 2013-12-19 ENCOUNTER — Emergency Department (HOSPITAL_COMMUNITY): Payer: Managed Care, Other (non HMO)

## 2013-12-19 ENCOUNTER — Encounter (HOSPITAL_COMMUNITY): Payer: Self-pay | Admitting: Emergency Medicine

## 2013-12-19 ENCOUNTER — Emergency Department (HOSPITAL_COMMUNITY)
Admission: EM | Admit: 2013-12-19 | Discharge: 2013-12-19 | Disposition: A | Discharge status: Home / Self Care | Payer: Managed Care, Other (non HMO) | Attending: Emergency Medicine | Admitting: Emergency Medicine

## 2013-12-19 DIAGNOSIS — R109 Unspecified abdominal pain: Secondary | ICD-10-CM

## 2013-12-19 DIAGNOSIS — M25561 Pain in right knee: Secondary | ICD-10-CM

## 2013-12-19 DIAGNOSIS — M25569 Pain in unspecified knee: Secondary | ICD-10-CM | POA: Insufficient documentation

## 2013-12-19 DIAGNOSIS — Z79899 Other long term (current) drug therapy: Secondary | ICD-10-CM | POA: Insufficient documentation

## 2013-12-19 DIAGNOSIS — Z880 Allergy status to penicillin: Secondary | ICD-10-CM | POA: Insufficient documentation

## 2013-12-19 DIAGNOSIS — R1013 Epigastric pain: Secondary | ICD-10-CM | POA: Insufficient documentation

## 2013-12-19 DIAGNOSIS — R112 Nausea with vomiting, unspecified: Secondary | ICD-10-CM | POA: Insufficient documentation

## 2013-12-19 LAB — CBC WITH DIFFERENTIAL/PLATELET
BASOS ABS: 0 10*3/uL (ref 0.0–0.1)
Basophils Relative: 0 % (ref 0–1)
EOS PCT: 4 % (ref 0–5)
Eosinophils Absolute: 0.5 10*3/uL (ref 0.0–0.7)
HEMATOCRIT: 33.9 % — AB (ref 36.0–46.0)
HEMOGLOBIN: 10.5 g/dL — AB (ref 12.0–15.0)
LYMPHS ABS: 3 10*3/uL (ref 0.7–4.0)
LYMPHS PCT: 22 % (ref 12–46)
MCH: 23 pg — ABNORMAL LOW (ref 26.0–34.0)
MCHC: 31 g/dL (ref 30.0–36.0)
MCV: 74.3 fL — ABNORMAL LOW (ref 78.0–100.0)
MONOS PCT: 8 % (ref 3–12)
Monocytes Absolute: 1.1 10*3/uL — ABNORMAL HIGH (ref 0.1–1.0)
NEUTROS PCT: 66 % (ref 43–77)
Neutro Abs: 8.9 10*3/uL — ABNORMAL HIGH (ref 1.7–7.7)
Platelets: 406 10*3/uL — ABNORMAL HIGH (ref 150–400)
RBC: 4.56 MIL/uL (ref 3.87–5.11)
RDW: 15.8 % — AB (ref 11.5–15.5)
WBC: 13.5 10*3/uL — ABNORMAL HIGH (ref 4.0–10.5)

## 2013-12-19 LAB — COMPREHENSIVE METABOLIC PANEL
ALT: 20 U/L (ref 0–35)
AST: 10 U/L (ref 0–37)
Albumin: 2.9 g/dL — ABNORMAL LOW (ref 3.5–5.2)
Alkaline Phosphatase: 107 U/L (ref 39–117)
BUN: 4 mg/dL — ABNORMAL LOW (ref 6–23)
CO2: 31 mEq/L (ref 19–32)
Calcium: 9.3 mg/dL (ref 8.4–10.5)
Chloride: 95 mEq/L — ABNORMAL LOW (ref 96–112)
Creatinine, Ser: 0.51 mg/dL (ref 0.50–1.10)
GFR calc Af Amer: >90 mL/min (ref >90)
GFR calc non Af Amer: >90 mL/min (ref >90)
GLUCOSE: 92 mg/dL (ref 70–99)
Potassium: 3 mEq/L — ABNORMAL LOW (ref 3.7–5.3)
Sodium: 139 mEq/L (ref 137–147)
TOTAL PROTEIN: 8.1 g/dL (ref 6.0–8.3)
Total Bilirubin: 0.2 mg/dL — ABNORMAL LOW (ref 0.3–1.2)

## 2013-12-19 LAB — URINALYSIS, ROUTINE W REFLEX MICROSCOPIC
Bilirubin Urine: NEGATIVE
Glucose, UA: NEGATIVE mg/dL
HGB URINE DIPSTICK: NEGATIVE
Ketones, ur: NEGATIVE mg/dL
Leukocytes, UA: NEGATIVE
Nitrite: NEGATIVE
PROTEIN: NEGATIVE mg/dL
Specific Gravity, Urine: 1.012 (ref 1.005–1.030)
Urobilinogen, UA: 0.2 mg/dL (ref 0.0–1.0)
pH: 7.5 (ref 5.0–8.0)

## 2013-12-19 MED ORDER — OXYCODONE-ACETAMINOPHEN 5-325 MG PO TABS: 1.0000 | ORAL_TABLET | Freq: Four times a day (QID) | ORAL | Status: DC | PRN

## 2013-12-19 MED ORDER — OXYCODONE-ACETAMINOPHEN 5-325 MG PO TABS
1.0000 | ORAL_TABLET | Freq: Once | ORAL | Status: AC
  Administered 2013-12-19: 1 via ORAL
  Filled 2013-12-19: qty 1

## 2013-12-19 MED ORDER — PANTOPRAZOLE SODIUM 40 MG IV SOLR: 40.0000 mg | Freq: Once | INTRAVENOUS | Status: DC

## 2013-12-19 MED ORDER — POTASSIUM CHLORIDE ER 10 MEQ PO TBCR: 10.0000 meq | EXTENDED_RELEASE_TABLET | Freq: Two times a day (BID) | ORAL | Status: DC

## 2013-12-19 NOTE — ED Notes (Signed)
Bed: WA25 Expected date:  Expected time:  Means of arrival:  Comments: triage 

## 2013-12-19 NOTE — ED Notes (Signed)
Per pt, seen last week for nerve pain.  Had passed out in ortho MD office.  Pt was given xrays and sent home with pain meds.  Pt started having abdominal pain yesterday.  Vomiting yesterday and has been nauseated today.  No fever.  No diarrhea.  Pt also c/o rt knee pain.  No injury.

## 2013-12-19 NOTE — Progress Notes (Signed)
   CARE MANAGEMENT ED NOTE 12/19/2013  Patient:  Katrina Oconnor,Katrina Oconnor   Account Number:  [id number]  Date Initiated:  12/19/2013  Documentation initiated by:  Radford PaxFERRERO,Ari Bernabei  Subjective/Objective Assessment:   Patient presents to Ed with abdominal pain n/v     Subjective/Objective Assessment Detail:     Action/Plan:   Action/Plan Detail:   Anticipated DC Date:       Status Recommendation to Physician:   Result of Recommendation:    Other ED Services  Consult Working Plan    DC Planning Services  Other  PCP issues    Choice offered to / List presented to:            Status of service:  Completed, signed off  ED Comments:   ED Comments Detail:  Patient confirms she does not have a pcp.  EDCM instructed patient to call the phone number on the back of her insurance card or go to insurance company website to help her find a physician who isclose to her and within network. EDCM discussed importance of having a pcp.  Patient verbalized understanding.  No further EDCM needs at this time.

## 2013-12-19 NOTE — ED Notes (Signed)
NP at bedside states pt needs potassium Rx

## 2013-12-19 NOTE — ED Provider Notes (Signed)
CSN: [id number]     Arrival date & time 12/19/13  1226 History   First MD Initiated Contact with Patient 12/19/13 1517     Chief Complaint  Patient presents with  . Abdominal Pain  . Knee Pain     (Consider location/radiation/quality/duration/timing/severity/associated sxs/prior Treatment) Patient is a 43 y.o. female presenting with abdominal pain and knee pain. The history is provided by the patient. No language interpreter was used.  Abdominal Pain Pain location:  Epigastric Pain quality: aching   Pain radiates to:  LUQ and RUQ Pain severity:  Moderate Onset quality:  Sudden Duration:  2 days Timing:  Intermittent Progression:  Waxing and waning Chronicity:  New Relieved by:  None tried Ineffective treatments:  None tried Associated symptoms: nausea and vomiting   Associated symptoms: no diarrhea   Knee Pain Location:  Knee Injury: no   Pain details:    Quality:  Shooting   Radiates to:  Does not radiate   Severity:  Moderate   Onset quality:  Sudden   Duration:  1 day   Timing:  Intermittent   Progression:  Waxing and waning Chronicity:  New Dislocation: no   Foreign body present:  No foreign bodies Prior injury to area:  No Worsened by:  Bearing weight Associated symptoms: no back pain and no decreased ROM     History reviewed. No pertinent past medical history. Past Surgical History  Procedure Laterality Date  . Cesarean section     History reviewed. No pertinent family history. History  Substance Use Topics  . Smoking status: Never Smoker   . Smokeless tobacco: Not on file  . Alcohol Use: No   OB History   Grav Para Term Preterm Abortions TAB SAB Ect Mult Living                 Review of Systems  Gastrointestinal: Positive for nausea, vomiting and abdominal pain. Negative for diarrhea.  Musculoskeletal: Positive for arthralgias. Negative for back pain.  All other systems reviewed and are negative.      Allergies  Penicillins  Home  Medications   Current Outpatient Rx  Name  Route  Sig  Dispense  Refill  . diazepam (VALIUM) 5 MG tablet   Oral   Take 1 tablet (5 mg total) by mouth 2 (two) times daily.   10 tablet   0   . HYDROcodone-acetaminophen (NORCO/VICODIN) 5-325 MG per tablet   Oral   Take 1-2 tablets by mouth every 4 (four) hours as needed.   10 tablet   0   . ibuprofen (ADVIL,MOTRIN) 200 MG tablet   Oral   Take 400 mg by mouth every 6 (six) hours as needed for moderate pain.         . predniSONE (DELTASONE) 20 MG tablet      2 tabs po daily x 4 days   8 tablet   0    BP 111/73  Pulse 102  Temp(Src) 99.3 F (37.4 C) (Oral)  Resp 18  SpO2 97%  LMP 11/27/2013 Physical Exam  Nursing note and vitals reviewed. Constitutional: She is oriented to person, place, and time. She appears well-developed and well-nourished.  HENT:  Head: Normocephalic.  Eyes: Pupils are equal, round, and reactive to light.  Neck: Normal range of motion.  Cardiovascular: Regular rhythm, normal heart sounds and intact distal pulses.   Pulmonary/Chest: Effort normal and breath sounds normal.  Abdominal: Soft. Bowel sounds are normal.  Musculoskeletal: She exhibits tenderness. She exhibits no  edema.  Lymphadenopathy:    She has no cervical adenopathy.  Neurological: She is alert and oriented to person, place, and time.  Skin: Skin is warm and dry. No rash noted.  Psychiatric: She has a normal mood and affect. Her behavior is normal. Judgment and thought content normal.    ED Course  Procedures (including critical care time) Labs Review Labs Reviewed  CBC WITH DIFFERENTIAL - Abnormal; Notable for the following:    WBC 13.5 (*)    Hemoglobin 10.5 (*)    HCT 33.9 (*)    MCV 74.3 (*)    MCH 23.0 (*)    RDW 15.8 (*)    Platelets 406 (*)    Neutro Abs 8.9 (*)    Monocytes Absolute 1.1 (*)    All other components within normal limits  COMPREHENSIVE METABOLIC PANEL - Abnormal; Notable for the following:     Potassium 3.0 (*)    Chloride 95 (*)    BUN 4 (*)    Albumin 2.9 (*)    Total Bilirubin 0.2 (*)    All other components within normal limits  URINALYSIS, ROUTINE W REFLEX MICROSCOPIC   Imaging Review No results found.   EKG Interpretation None     Patient with diffuse epigastric pain, associated with nausea, one episode of emesis yesterday.  No elevation in LFT's.  Mild leukocytosis with increase in neutrophils.  Mild anemia and hypokalemia. Right knee pain with no known injury.  Area over joint with increased warmth as compared to left.  [place] results reviewed.  No acute findings.   Right knee with mild effusion.  Knee sleeve, crutches.  Short course of K-dur for potassium replacement.  Return precautions discussed.  MDM   Final diagnoses:  None    Epigastric pain. Right knee pain.    Jimmye Norman, NP 12/20/13 [phone]

## 2013-12-19 NOTE — Discharge Instructions (Signed)
Abdominal Pain, Women °Abdominal (stomach, pelvic, or belly) pain can be caused by many things. It is important to tell your doctor: °· The location of the pain. °· Does it come and go or is it present all the time? °· Are there things that start the pain (eating certain foods, exercise)? °· Are there other symptoms associated with the pain (fever, nausea, vomiting, diarrhea)? °All of this is helpful to know when trying to find the cause of the pain. °CAUSES  °· Stomach: virus or bacteria infection, or ulcer. °· Intestine: appendicitis (inflamed appendix), regional ileitis (Crohn's disease), ulcerative colitis (inflamed colon), irritable bowel syndrome, diverticulitis (inflamed diverticulum of the colon), or cancer of the stomach or intestine. °· Gallbladder disease or stones in the gallbladder. °· Kidney disease, kidney stones, or infection. °· Pancreas infection or cancer. °· Fibromyalgia (pain disorder). °· Diseases of the female organs: °· Uterus: fibroid (non-cancerous) tumors or infection. °· Fallopian tubes: infection or tubal pregnancy. °· Ovary: cysts or tumors. °· Pelvic adhesions (scar tissue). °· Endometriosis (uterus lining tissue growing in the pelvis and on the pelvic organs). °· Pelvic congestion syndrome (female organs filling up with blood just before the menstrual period). °· Pain with the menstrual period. °· Pain with ovulation (producing an egg). °· Pain with an IUD (intrauterine device, birth control) in the uterus. °· Cancer of the female organs. °· Functional pain (pain not caused by a disease, may improve without treatment). °· Psychological pain. °· Depression. °DIAGNOSIS  °Your doctor will decide the seriousness of your pain by doing an examination. °· Blood tests. °· X-rays. °· Ultrasound. °· CT scan (computed tomography, special type of X-ray). °· MRI (magnetic resonance imaging). °· Cultures, for infection. °· Barium enema (dye inserted in the large intestine, to better view it with  X-rays). °· Colonoscopy (looking in intestine with a lighted tube). °· Laparoscopy (minor surgery, looking in abdomen with a lighted tube). °· Major abdominal exploratory surgery (looking in abdomen with a large incision). °TREATMENT  °The treatment will depend on the cause of the pain.  °· Many cases can be observed and treated at home. °· Over-the-counter medicines recommended by your caregiver. °· Prescription medicine. °· Antibiotics, for infection. °· Birth control pills, for painful periods or for ovulation pain. °· Hormone treatment, for endometriosis. °· Nerve blocking injections. °· Physical therapy. °· Antidepressants. °· Counseling with a psychologist or psychiatrist. °· Minor or major surgery. °HOME CARE INSTRUCTIONS  °· Do not take laxatives, unless directed by your caregiver. °· Take over-the-counter pain medicine only if ordered by your caregiver. Do not take aspirin because it can cause an upset stomach or bleeding. °· Try a clear liquid diet (broth or water) as ordered by your caregiver. Slowly move to a bland diet, as tolerated, if the pain is related to the stomach or intestine. °· Have a thermometer and take your temperature several times a day, and record it. °· Bed rest and sleep, if it helps the pain. °· Avoid sexual intercourse, if it causes pain. °· Avoid stressful situations. °· Keep your follow-up appointments and tests, as your caregiver orders. °· If the pain does not go away with medicine or surgery, you may try: °· Acupuncture. °· Relaxation exercises (yoga, meditation). °· Group therapy. °· Counseling. °SEEK MEDICAL CARE IF:  °· You notice certain foods cause stomach pain. °· Your home care treatment is not helping your pain. °· You need stronger pain medicine. °· You want your IUD removed. °· You feel faint or   lightheaded.  You develop nausea and vomiting.  You develop a rash.  You are having side effects or an allergy to your medicine. SEEK IMMEDIATE MEDICAL CARE IF:   Your  pain does not go away or gets worse.  You have a fever.  Your pain is felt only in portions of the abdomen. The right side could possibly be appendicitis. The left lower portion of the abdomen could be colitis or diverticulitis.  You are passing blood in your stools (bright red or black tarry stools, with or without vomiting).  You have blood in your urine.  You develop chills, with or without a fever.  You pass out. MAKE SURE YOU:   Understand these instructions.  Will watch your condition.  Will get help right away if you are not doing well or get worse. Document Released: 07/31/2007 Document Revised: 12/26/2011 Document Reviewed: 08/20/2009 [facility] Patient Information 2014 [place], [place]. Knee Effusion The medical term for having fluid in your knee is effusion. This is often due to an internal derangement of the knee. This means something is wrong inside the knee. Some of the causes of fluid in the knee may be torn cartilage, a torn ligament, or bleeding into the joint from an injury. Your knee is likely more difficult to bend and move. This is often because there is increased pain and pressure in the joint. The time it takes for recovery from a knee effusion depends on different factors, including:   Type of injury.  Your age.  Physical and medical conditions.  Rehabilitation Strategies. How long you will be away from your normal activities will depend on what kind of knee problem you have and how much damage is present. Your knee has two types of cartilage. Articular cartilage covers the bone ends and lets your knee bend and move smoothly. Two menisci, thick pads of cartilage that form a rim inside the joint, help absorb shock and stabilize your knee. Ligaments bind the bones together and support your knee joint. Muscles move the joint, help support your knee, and take stress off the joint itself. CAUSES  Often an effusion in the knee is caused by an injury to one of the  menisci. This is often a tear in the cartilage. Recovery after a meniscus injury depends on how much meniscus is damaged and whether you have damaged other knee tissue. Small tears may heal on their own with conservative treatment. Conservative means rest, limited weight bearing activity and muscle strengthening exercises. Your recovery may take up to 6 weeks.  TREATMENT  Larger tears may require surgery. Meniscus injuries may be treated during arthroscopy. Arthroscopy is a procedure in which your surgeon uses a small telescope like instrument to look in your knee. Your caregiver can make a more accurate diagnosis (learning what is wrong) by performing an arthroscopic procedure. If your injury is on the inner margin of the meniscus, your surgeon may trim the meniscus back to a smooth rim. In other cases your surgeon will try to repair a damaged meniscus with stitches (sutures). This may make rehabilitation take longer, but may provide better long term result by helping your knee keep its shock absorption capabilities. Ligaments which are completely torn usually require surgery for repair. HOME CARE INSTRUCTIONS  Use crutches as instructed.  If a brace is applied, use as directed.  Once you are home, an ice pack applied to your swollen knee may help with discomfort and help decrease swelling.  Keep your knee raised (elevated) when  you are not up and around or on crutches.  Only take over-the-counter or prescription medicines for pain, discomfort, or fever as directed by your caregiver.  Your caregivers will help with instructions for rehabilitation of your knee. This often includes strengthening exercises.  You may resume a normal diet and activities as directed. SEEK MEDICAL CARE IF:   There is increased swelling in your knee.  You notice redness, swelling, or increasing pain in your knee.  An unexplained oral temperature above 102 F (38.9 C) develops. SEEK IMMEDIATE MEDICAL CARE IF:    You develop a rash.  You have difficulty breathing.  You have any allergic reactions from medications you may have been given.  There is severe pain with any motion of the knee. MAKE SURE YOU:   Understand these instructions.  Will watch your condition.  Will get help right away if you are not doing well or get worse. Document Released: 12/24/2003 Document Revised: 12/26/2011 Document Reviewed: 02/27/2008 [facility] Patient Information 2014 [place], [place].   FOLLOW-UP WITH YOUR ORTHOPEDIST IF KNEE PAIN PERSISTS DESPITE CURRENT TREATMENT PLAN.  CONTACT YOUR INSURANCE CARRIER TO OBTAIN A LIST OF PHYSICIANS IN YOUR NETWORK ACCEPTING NEW PATIENTS.

## 2013-12-24 NOTE — ED Provider Notes (Signed)
Medical screening examination/treatment/procedure(s) were conducted as a shared visit with non-physician practitioner(s) and myself.  I personally evaluated the patient during the encounter.   EKG Interpretation None      RUQ pain. Labs okay. US without gallstones. Dc home in good condition. Suspect right knee inflammatory arthritis. Doubt septic joint  Lyanne CoKevin M Shaleen Talamantez, MD 12/24/13 [phone]

## 2014-01-29 ENCOUNTER — Other Ambulatory Visit: Payer: Self-pay

## 2014-01-29 DIAGNOSIS — Z1231 Encounter for screening mammogram for malignant neoplasm of breast: Secondary | ICD-10-CM

## 2014-02-26 ENCOUNTER — Ambulatory Visit
Admission: RE | Admit: 2014-02-26 | Discharge: 2014-02-26 | Disposition: A | Discharge status: Home / Self Care | Payer: Managed Care, Other (non HMO) | Source: Ambulatory Visit

## 2014-02-26 ENCOUNTER — Encounter (INDEPENDENT_AMBULATORY_CARE_PROVIDER_SITE_OTHER): Payer: Self-pay

## 2014-02-26 ENCOUNTER — Other Ambulatory Visit: Payer: Self-pay

## 2014-02-26 DIAGNOSIS — Z1231 Encounter for screening mammogram for malignant neoplasm of breast: Secondary | ICD-10-CM

## 2014-03-03 ENCOUNTER — Other Ambulatory Visit: Payer: Self-pay | Admitting: Gastroenterology

## 2014-04-15 ENCOUNTER — Encounter (HOSPITAL_COMMUNITY): Payer: Self-pay | Admitting: Pharmacy Technician

## 2014-04-30 ENCOUNTER — Encounter (HOSPITAL_COMMUNITY): Payer: Self-pay | Admitting: *Deleted

## 2014-05-06 ENCOUNTER — Ambulatory Visit (HOSPITAL_COMMUNITY)
Admission: RE | Admit: 2014-05-06 | Discharge: 2014-05-06 | Disposition: A | Discharge status: Home / Self Care | Payer: Managed Care, Other (non HMO) | Source: Ambulatory Visit | Attending: Gastroenterology | Admitting: Gastroenterology

## 2014-05-06 ENCOUNTER — Encounter (HOSPITAL_COMMUNITY)
Admission: RE | Disposition: A | Discharge status: Home / Self Care | Payer: Self-pay | Source: Ambulatory Visit | Attending: Gastroenterology

## 2014-05-06 ENCOUNTER — Ambulatory Visit (HOSPITAL_COMMUNITY): Payer: Managed Care, Other (non HMO) | Admitting: Registered Nurse

## 2014-05-06 ENCOUNTER — Encounter (HOSPITAL_COMMUNITY): Payer: Managed Care, Other (non HMO) | Admitting: Registered Nurse

## 2014-05-06 ENCOUNTER — Encounter (HOSPITAL_COMMUNITY): Payer: Self-pay | Admitting: *Deleted

## 2014-05-06 DIAGNOSIS — D509 Iron deficiency anemia, unspecified: Secondary | ICD-10-CM | POA: Insufficient documentation

## 2014-05-06 DIAGNOSIS — K5120 Ulcerative (chronic) proctitis without complications: Secondary | ICD-10-CM | POA: Insufficient documentation

## 2014-05-06 DIAGNOSIS — K219 Gastro-esophageal reflux disease without esophagitis: Secondary | ICD-10-CM | POA: Insufficient documentation

## 2014-05-06 DIAGNOSIS — R195 Other fecal abnormalities: Secondary | ICD-10-CM | POA: Insufficient documentation

## 2014-05-06 HISTORY — DX: Melena: K92.1

## 2014-05-06 HISTORY — PX: COLONOSCOPY WITH PROPOFOL: SHX5780

## 2014-05-06 HISTORY — DX: Unspecified osteoarthritis, unspecified site: M19.90

## 2014-05-06 HISTORY — PX: ESOPHAGOGASTRODUODENOSCOPY (EGD) WITH PROPOFOL: SHX5813

## 2014-05-06 HISTORY — DX: Gastro-esophageal reflux disease without esophagitis: K21.9

## 2014-05-06 SURGERY — ESOPHAGOGASTRODUODENOSCOPY (EGD) WITH PROPOFOL
Anesthesia: Monitor Anesthesia Care

## 2014-05-06 MED ORDER — MIDAZOLAM HCL 5 MG/5ML IJ SOLN
INTRAMUSCULAR | Status: DC | PRN
  Administered 2014-05-06: 1 mg via INTRAVENOUS

## 2014-05-06 MED ORDER — SODIUM CHLORIDE 0.9 % IV SOLN: INTRAVENOUS | Status: DC

## 2014-05-06 MED ORDER — BUTAMBEN-TETRACAINE-BENZOCAINE 2-2-14 % EX AERO
INHALATION_SPRAY | CUTANEOUS | Status: DC | PRN
  Administered 2014-05-06: 2 via TOPICAL

## 2014-05-06 MED ORDER — LACTATED RINGERS IV SOLN
INTRAVENOUS | Status: DC
  Administered 2014-05-06: 1000 mL via INTRAVENOUS

## 2014-05-06 MED ORDER — MIDAZOLAM HCL 2 MG/2ML IJ SOLN
INTRAMUSCULAR | Status: AC
  Filled 2014-05-06: qty 2

## 2014-05-06 MED ORDER — PROPOFOL INFUSION 10 MG/ML OPTIME
INTRAVENOUS | Status: DC | PRN
  Administered 2014-05-06: 250 ug/kg/min via INTRAVENOUS

## 2014-05-06 MED ORDER — GLYCOPYRROLATE 0.2 MG/ML IJ SOLN
INTRAMUSCULAR | Status: AC
  Filled 2014-05-06: qty 1

## 2014-05-06 MED ORDER — LABETALOL HCL 5 MG/ML IV SOLN
INTRAVENOUS | Status: DC | PRN
  Administered 2014-05-06: 5 mg via INTRAVENOUS

## 2014-05-06 MED ORDER — LIDOCAINE HCL (CARDIAC) 20 MG/ML IV SOLN
INTRAVENOUS | Status: AC
  Filled 2014-05-06: qty 5

## 2014-05-06 MED ORDER — PROPOFOL 10 MG/ML IV BOLUS
INTRAVENOUS | Status: AC
  Filled 2014-05-06: qty 20

## 2014-05-06 SURGICAL SUPPLY — 25 items

## 2014-05-06 NOTE — Transfer of Care (Signed)
Immediate Anesthesia Transfer of Care Note  Patient: Keyaira L Bishara  Procedure(s) Performed: Procedure(s): ESOPHAGOGASTRODUODENOSCOPY (EGD) WITH PROPOFOL (N/A) COLONOSCOPY WITH PROPOFOL (N/A)  Patient Location: PACU  Anesthesia Type:MAC  Level of Consciousness: awake, alert , oriented and patient cooperative  Airway & Oxygen Therapy: Patient Spontanous Breathing and Patient connected to nasal cannula oxygen  Post-op Assessment: Report given to PACU RN, Post -op Vital signs reviewed and stable and Patient moving all extremities X 4  Post vital signs: stable  Complications: No apparent anesthesia complications

## 2014-05-06 NOTE — Discharge Instructions (Signed)
Colonoscopy, Care After °These instructions give you information on caring for yourself after your procedure. Your doctor may also give you more specific instructions. Call your doctor if you have any problems or questions after your procedure. °HOME CARE °· Do not drive for 24 hours. °· Do not sign important papers or use machinery for 24 hours. °· You may shower. °· You may go back to your usual activities, but go slower for the first 24 hours. °· Take rest breaks often during the first 24 hours. °· Walk around or use warm packs on your belly (abdomen) if you have belly cramping or gas. °· Drink enough fluids to keep your pee (urine) clear or pale yellow. °· Resume your normal diet. Avoid heavy or fried foods. °· Avoid drinking alcohol for 24 hours or as told by your doctor. °· Only take medicines as told by your doctor. °If a tissue sample (biopsy) was taken during the procedure:  °· Do not take aspirin or blood thinners for 7 days, or as told by your doctor. °· Do not drink alcohol for 7 days, or as told by your doctor. °· Eat soft foods for the first 24 hours. °GET HELP IF: °You still have a small amount of blood in your poop (stool) 2-3 days after the procedure. °GET HELP RIGHT AWAY IF: °· You have more than a small amount of blood in your poop. °· You see clumps of tissue (blood clots) in your poop. °· Your belly is puffy (swollen). °· You feel sick to your stomach (nauseous) or throw up (vomit). °· You have a fever. °· You have belly pain that gets worse and medicine does not help. °MAKE SURE YOU: °· Understand these instructions. °· Will watch your condition. °· Will get help right away if you are not doing well or get worse. °Document Released: 11/05/2010 Document Revised: 10/08/2013 Document Reviewed: 06/10/2013 °ExitCare® Patient Information ©2015 [employer], LLC. This information is not intended to replace advice given to you by your health care provider. Make sure you discuss any questions you have with  your health care provider. °Esophagogastroduodenoscopy °Care After °Refer to this sheet in the next few weeks. These instructions provide you with information on caring for yourself after your procedure. Your caregiver may also give you more specific instructions. Your treatment has been planned according to current medical practices, but problems sometimes occur. Call your caregiver if you have any problems or questions after your procedure.  °HOME CARE INSTRUCTIONS °· Do not eat or drink anything until the numbing medicine (local anesthetic) has worn off and your gag reflex has returned. You will know that the local anesthetic has worn off when you can swallow comfortably. °· Do not drive for 12 hours after the procedure or as directed by your caregiver. °· Only take medicines as directed by your caregiver. °SEEK MEDICAL CARE IF:  °· You cannot stop coughing. °· You are not urinating at all or less than usual. °SEEK IMMEDIATE MEDICAL CARE IF: °· You have difficulty swallowing. °· You cannot eat or drink. °· You have worsening throat or chest pain. °· You have dizziness, lightheadedness, or you faint. °· You have nausea or vomiting. °· You have chills. °· You have a fever. °· You have severe abdominal pain. °· You have black, tarry, or bloody stools. °Document Released: 09/19/2012 Document Reviewed: 09/19/2012 °ExitCare® Patient Information ©2015 [employer], LLC. This information is not intended to replace advice given to you by your health care provider. Make sure you   discuss any questions you have with your health care provider. ° °

## 2014-05-06 NOTE — Anesthesia Preprocedure Evaluation (Addendum)
Anesthesia Evaluation  Patient identified by MRN, date of birth, ID band Patient awake    Reviewed: Allergy & Precautions, H&P , NPO status , Patient's Chart, lab work & pertinent test results  Airway Mallampati: II TM Distance: >3 FB     Dental  (+) Teeth Intact, Missing, Dental Advisory Given,    Pulmonary neg pulmonary ROS,  breath sounds clear to auscultation  Pulmonary exam normal       Cardiovascular negative cardio ROS  Rhythm:Regular Rate:Normal     Neuro/Psych negative neurological ROS  negative psych ROS   GI/Hepatic Neg liver ROS, GERD-  ,  Endo/Other  negative endocrine ROS  Renal/GU negative Renal ROS  negative genitourinary   Musculoskeletal negative musculoskeletal ROS (+)   Abdominal   Peds  Hematology  (+) anemia ,   Anesthesia Other Findings   Reproductive/Obstetrics                          Anesthesia Physical Anesthesia Plan  ASA: II  Anesthesia Plan: MAC   Post-op Pain Management:    Induction: Intravenous  Airway Management Planned: Simple Face Mask and Nasal Cannula  Additional Equipment:   Intra-op Plan:   Post-operative Plan:   Informed Consent: I have reviewed the patients History and Physical, chart, labs and discussed the procedure including the risks, benefits and alternatives for the proposed anesthesia with the patient or authorized representative who has indicated his/her understanding and acceptance.     Plan Discussed with: CRNA  Anesthesia Plan Comments:         Anesthesia Quick Evaluation

## 2014-05-06 NOTE — Anesthesia Postprocedure Evaluation (Signed)
Anesthesia Post Note  Patient: Katrina Oconnor  Procedure(s) Performed: Procedure(s) (LRB): ESOPHAGOGASTRODUODENOSCOPY (EGD) WITH PROPOFOL (N/A) COLONOSCOPY WITH PROPOFOL (N/A)  Anesthesia type: MAC  Patient location: PACU  Post pain: Pain level controlled  Post assessment: Post-op Vital signs reviewed  Last Vitals:  Filed Vitals:   05/06/14 1344  BP: 155/76  Temp:   Resp:     Post vital signs: Reviewed  Level of consciousness: sedated  Complications: No apparent anesthesia complications

## 2014-05-06 NOTE — Op Note (Signed)
Problem: Iron deficiency anemia with heme positive stool. Hemoglobin 9.9 g. Serum iron saturation 5%. Premenopausal female.  Endoscopist: Danise EdgeMartin Eliga Arvie  Premedication: Propofol administered by anesthesia  Procedure: Diagnostic esophagogastroduodenoscopy The patient was placed in the left lateral decubitus position. The Pentax gastroscope was passed through the posterior hypopharynx into the proximal esophagus without difficulty. The hypopharynx, larynx, and vocal cords appeared normal.  Esophagoscopy: The proximal, mid, and lower segments of the esophageal mucosa appeared normal. The squamocolumnar junction was regular in appearance and noted at approximately 36 cm from the incisor teeth.  Gastroscopy: Retroflex view of the gastric cardia and fundus was normal. The gastric body, antrum, and pylorus appeared normal.  Duodenoscopy: The duodenal bulb and descending duodenum appeared normal.  Assessment: Normal diagnostic esophagogastroduodenoscopy  Procedure: Diagnostic colonoscopy Anal inspection and digital rectal exam were normal. The Pentax pediatric colonoscope was introduced into the rectum and advanced to the cecum. A normal-appearing appendiceal orifice and ileocecal valve were identified. A normal-appearing ileocecal valve was intubated and the terminal ileum inspected. Colonic preparation for the exam today was good.  The patient has mild, universal, nonspecific ulcerative proctocolitis manifested by generalized friable mucosa, granular appearing mucosa, and generalized loss of the mucosal vascular pattern without deep ulcers, polyps. The cecum, ileocecal valve, and terminal ileum appeared normal.  Random colonic biopsies were performed to evaluate the proctocolitis.  Assessment: Generalized, nonspecific proctocolitis sparing the cecum, ileocecal valve, and terminal ileum. Random colon biopsies pending.

## 2014-05-06 NOTE — H&P (Signed)
Problem: Iron deficiency anemia and heme positive stool  History: The patient is a 43 year old premenopausal female born Aug 29, 1971. She was diagnosed with iron deficiency anemia and heme positive stool based on a hemoglobin 9.9 g and serum iron saturation 5%.  The patient tells me she has a chronic history of iron deficiency anemia but reports no heavy menstrual periods.  The patient denies a family history of colon cancer. He denies gastrointestinal bleeding, abdominal pain, nonsteroidal anti-inflammatory medication use, history of peptic ulcer disease, diarrhea, or constipation.  The patient is scheduled to undergo diagnostic esophagogastroduodenoscopy and colonoscopy  Past medical history: Cesarean sections x2  Medication allergies: None  Exam: The patient is alert and lying comfortably on the endoscopy stretcher. Abdomen is soft and nontender to palpation. Lungs are clear to auscultation. Cardiac exam reveals a regular rhythm  Plan: Proceed with diagnostic esophagogastroduodenoscopy and colonoscopy

## 2014-05-07 ENCOUNTER — Encounter (HOSPITAL_COMMUNITY): Payer: Self-pay | Admitting: Gastroenterology

## 2015-03-24 ENCOUNTER — Encounter: Payer: Self-pay | Admitting: Dietician

## 2015-03-24 ENCOUNTER — Encounter: Payer: Managed Care, Other (non HMO) | Attending: Obstetrics and Gynecology | Admitting: Dietician

## 2015-03-24 VITALS — Ht <= 58 in | Wt 197.4 lb

## 2015-03-24 DIAGNOSIS — E785 Hyperlipidemia, unspecified: Secondary | ICD-10-CM | POA: Insufficient documentation

## 2015-03-24 DIAGNOSIS — Z713 Dietary counseling and surveillance: Secondary | ICD-10-CM | POA: Insufficient documentation

## 2015-03-24 NOTE — Patient Instructions (Addendum)
-  Keep working on eating together as a family and cooking at home  -Work on having breakfast every day  -Boiled eggs, fruit, protein bar, Balanced Break, string cheese, nuts  -Try to pair a carb with protein  -Increase fiber intake (see list)  -Work on cutting back on added salt -Keep choosing fresh or frozen vegetables -Keep rinsing canned vegetables  -Work on cutting back on Pepsi, sweet tea, and juice -Eat fresh fruit instead of juice  -Limit "bad" saturated fats: fried foods, fatty meats, bakery goods  -Increase physical activity

## 2015-03-24 NOTE — Progress Notes (Signed)
  Medical Nutrition Therapy:  Appt start time: 1040 end time:  1150   Assessment:  Primary concerns today: Katrina Oconnor states that she is here to discuss her high cholesterol. 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 She lives with her husband and 2 children. She does the grocery shopping and cooking. The family usually eats out 3-4x a week, more recently since their stove is broken. They try to eat together as a family but usually eat in separate rooms.   Samples provided and patient instructed on proper use: Bariatric Advantage Iron Chewy Bite (qty 2 bottles - chocolate raspberry) Lot#: [id number] Exp: 05/2015  Preferred Learning Style:   No preference indicated   Learning Readiness:  Ready   MEDICATIONS: vitamin D   DIETARY INTAKE:  Avoided foods include green peas, tofu, avocado, and chocolate.    24-hr recall:  B ( AM): sometimes skips, sometimes has 2 eggs with toast or Froot Loops  Snk ( AM):   L (1 PM): salad with cheese and vegetables Snk ( PM): sometimes, nuts or chips D ( PM): baked or sometimes fried chicken or fish or hot dogs, [place]key burgers or meatloaf, steamed vegetables or salad, fried apples, corn, black eyed peas Snk ( PM): chips, nuts, or fruit  Beverages: Pepsi (about 24-32 oz per day), some water, Ginger ale, occasionally orange juice  Usual physical activity: "not much" has a treadmill but doesn't use it  Estimated energy needs: 1600-1800 calories  Progress Towards Goal(s):  In progress.   Nutritional Diagnosis:  [place]-2.2 Altered nutrition-related laboratory As related to obesity and inappropriate food choices.  As evidenced by elevated total cholesterol.    Intervention:  Nutrition counseling provided. Goals: -Keep working on eating together as a family and cooking at home -Work on having breakfast every day  -Boiled eggs, fruit, protein bar, Balanced Break, string  cheese, nuts  -Try to pair a carb with protein -Increase fiber intake (see list) -Work on cutting back on added salt -Keep choosing fresh or frozen vegetables -Keep rinsing canned vegetables -Work on cutting back on Pepsi, sweet tea, and juice -Eat fresh fruit instead of juice -Limit "bad" saturated fats: fried foods, fatty meats, bakery goods -Increase physical activity  Teaching Method Utilized:  Visual Auditory Hands on  Handouts given during visit include:  MyPlate  Meal planning card  High fiber food list  High fiber nutrition therapy  Barriers to learning/adherence to lifestyle change: none  Demonstrated degree of understanding via:  Teach Back   Monitoring/Evaluation:  Dietary intake, exercise, and body weight in 4 month(s).

## 2015-06-30 ENCOUNTER — Ambulatory Visit: Payer: Managed Care, Other (non HMO) | Admitting: Dietician

## 2018-06-14 ENCOUNTER — Ambulatory Visit (HOSPITAL_COMMUNITY)
Admission: EM | Admit: 2018-06-14 | Discharge: 2018-06-14 | Disposition: A | Discharge status: Home / Self Care | Payer: Managed Care, Other (non HMO) | Attending: Family Medicine | Admitting: Family Medicine

## 2018-06-14 ENCOUNTER — Encounter (HOSPITAL_COMMUNITY): Payer: Self-pay | Admitting: Emergency Medicine

## 2018-06-14 DIAGNOSIS — E860 Dehydration: Secondary | ICD-10-CM

## 2018-06-14 DIAGNOSIS — R110 Nausea: Secondary | ICD-10-CM | POA: Diagnosis not present

## 2018-06-14 DIAGNOSIS — M7591 Shoulder lesion, unspecified, right shoulder: Secondary | ICD-10-CM | POA: Diagnosis not present

## 2018-06-14 DIAGNOSIS — M5442 Lumbago with sciatica, left side: Secondary | ICD-10-CM

## 2018-06-14 DIAGNOSIS — Z79899 Other long term (current) drug therapy: Secondary | ICD-10-CM | POA: Diagnosis not present

## 2018-06-14 DIAGNOSIS — Z880 Allergy status to penicillin: Secondary | ICD-10-CM | POA: Diagnosis not present

## 2018-06-14 DIAGNOSIS — R197 Diarrhea, unspecified: Secondary | ICD-10-CM

## 2018-06-14 DIAGNOSIS — M545 Low back pain: Secondary | ICD-10-CM | POA: Diagnosis present

## 2018-06-14 DIAGNOSIS — M1990 Unspecified osteoarthritis, unspecified site: Secondary | ICD-10-CM | POA: Insufficient documentation

## 2018-06-14 DIAGNOSIS — M7581 Other shoulder lesions, right shoulder: Secondary | ICD-10-CM

## 2018-06-14 DIAGNOSIS — Z9889 Other specified postprocedural states: Secondary | ICD-10-CM | POA: Diagnosis not present

## 2018-06-14 DIAGNOSIS — M778 Other enthesopathies, not elsewhere classified: Secondary | ICD-10-CM

## 2018-06-14 LAB — POCT I-STAT, CHEM 8
CALCIUM ION: 1.13 mmol/L — AB (ref 1.15–1.40)
Chloride: 98 mmol/L (ref 98–111)
Creatinine, Ser: 0.6 mg/dL (ref 0.44–1.00)
Glucose, Bld: 90 mg/dL (ref 70–99)
HCT: 33 % — ABNORMAL LOW (ref 36.0–46.0)
HEMOGLOBIN: 11.2 g/dL — AB (ref 12.0–15.0)
Potassium: 3.2 mmol/L — ABNORMAL LOW (ref 3.5–5.1)
SODIUM: 141 mmol/L (ref 135–145)
TCO2: 31 mmol/L (ref 22–32)

## 2018-06-14 LAB — COMPREHENSIVE METABOLIC PANEL
ALK PHOS: 100 U/L (ref 38–126)
ALT: 11 U/L (ref 0–44)
ANION GAP: 13 (ref 5–15)
AST: 15 U/L (ref 15–41)
Albumin: 3 g/dL — ABNORMAL LOW (ref 3.5–5.0)
BILIRUBIN TOTAL: 0.6 mg/dL (ref 0.3–1.2)
BUN: <5 mg/dL — ABNORMAL LOW (ref 6–20)
CALCIUM: 9 mg/dL (ref 8.9–10.3)
CO2: 30 mmol/L (ref 22–32)
CREATININE: 0.73 mg/dL (ref 0.44–1.00)
Chloride: 98 mmol/L (ref 98–111)
Glucose, Bld: 90 mg/dL (ref 70–99)
Potassium: 3.1 mmol/L — ABNORMAL LOW (ref 3.5–5.1)
Sodium: 141 mmol/L (ref 135–145)
TOTAL PROTEIN: 8.4 g/dL — AB (ref 6.5–8.1)

## 2018-06-14 LAB — CBC
HCT: 33.3 % — ABNORMAL LOW (ref 36.0–46.0)
HEMOGLOBIN: 10 g/dL — AB (ref 12.0–15.0)
MCH: 23.1 pg — AB (ref 26.0–34.0)
MCHC: 30 g/dL (ref 30.0–36.0)
MCV: 76.9 fL — AB (ref 78.0–100.0)
Platelets: 404 10*3/uL — ABNORMAL HIGH (ref 150–400)
RBC: 4.33 MIL/uL (ref 3.87–5.11)
RDW: 15.6 % — ABNORMAL HIGH (ref 11.5–15.5)
WBC: 10.2 10*3/uL (ref 4.0–10.5)

## 2018-06-14 LAB — POCT URINALYSIS DIP (DEVICE)
GLUCOSE, UA: NEGATIVE mg/dL
HGB URINE DIPSTICK: NEGATIVE
Ketones, ur: 40 mg/dL — AB
LEUKOCYTES UA: NEGATIVE
NITRITE: NEGATIVE
PH: 7 (ref 5.0–8.0)
PROTEIN: 30 mg/dL — AB
SPECIFIC GRAVITY, URINE: 1.015 (ref 1.005–1.030)
UROBILINOGEN UA: 0.2 mg/dL (ref 0.0–1.0)

## 2018-06-14 LAB — LIPASE, BLOOD: LIPASE: 25 U/L (ref 11–51)

## 2018-06-14 MED ORDER — LOPERAMIDE HCL 2 MG PO CAPS: 2.0000 mg | ORAL_CAPSULE | Freq: Four times a day (QID) | ORAL | 0 refills | Status: DC | PRN

## 2018-06-14 MED ORDER — ONDANSETRON HCL 4 MG PO TABS: 4.0000 mg | ORAL_TABLET | Freq: Three times a day (TID) | ORAL | 0 refills | Status: DC | PRN

## 2018-06-14 MED ORDER — ONDANSETRON 4 MG PO TBDP
ORAL_TABLET | ORAL | Status: AC
  Filled 2018-06-14: qty 1

## 2018-06-14 MED ORDER — CYCLOBENZAPRINE HCL 5 MG PO TABS: 5.0000 mg | ORAL_TABLET | Freq: Every day | ORAL | 0 refills | Status: DC

## 2018-06-14 MED ORDER — KETOROLAC TROMETHAMINE 60 MG/2ML IM SOLN
INTRAMUSCULAR | Status: AC
  Filled 2018-06-14: qty 2

## 2018-06-14 MED ORDER — NAPROXEN 500 MG PO TABS: 500.0000 mg | ORAL_TABLET | Freq: Two times a day (BID) | ORAL | 0 refills | Status: DC

## 2018-06-14 MED ORDER — KETOROLAC TROMETHAMINE 60 MG/2ML IM SOLN: 60.0000 mg | Freq: Once | INTRAMUSCULAR | Status: DC

## 2018-06-14 MED ORDER — ONDANSETRON 4 MG PO TBDP
4.0000 mg | ORAL_TABLET | Freq: Once | ORAL | Status: AC
  Administered 2018-06-14: 4 mg via ORAL

## 2018-06-14 MED ORDER — POTASSIUM CHLORIDE CRYS ER 20 MEQ PO TBCR: 20.0000 meq | EXTENDED_RELEASE_TABLET | Freq: Every day | ORAL | 0 refills | Status: DC

## 2018-06-14 MED ORDER — ACETAMINOPHEN 500 MG PO TABS: 500.0000 mg | ORAL_TABLET | Freq: Four times a day (QID) | ORAL | 0 refills | Status: DC | PRN

## 2018-06-14 NOTE — Discharge Instructions (Signed)
I am concerned about your temp and heart rate today.  Your urine and labs which resulted today do indicate dehydration. Please increase your fluid intake, especially while having diarrhea.  Small frequent sips of fluids- Pedialyte, Gatorade, water, broth- to maintain hydration.   Tylenol regularly to help with pain and fevers.  Imodium to help with diarrhea.  Zofran as needed for nausea. If still with diarrhea you can bring back a sample which we can test for bacteria.  Naproxen twice a day to help with shoulder and back pain. Take with food. Bland diet as tolerated.  Muscle relaxer at night. May cause drowsiness. Please do not take if driving or drinking alcohol.   You may need further evaluation and treatment if your symptoms are not improving or if they are worsening. If any worsening of pain, fevers, weakness, dehydration please go to the Er. I have additional labs pending and would call you if these return abnormal.

## 2018-06-14 NOTE — ED Triage Notes (Signed)
PT arrrives with multiple complaints. PT reports right shoulder pain for 1 week. Diarrhea for 1 week. Has not eaten in 2 days. 6 episodes diarrhea in last 24 hours. PT reports lower back pain with radiation into both upper legs for 1 week. Sciatic nerve issues for last 4 years.  Febrile in triage at 100.3

## 2018-06-14 NOTE — ED Notes (Signed)
Pt drinking water 

## 2018-06-14 NOTE — ED Provider Notes (Signed)
[facility]    CSN: [id number] Arrival date & time: 06/14/18  1930     History   Chief Complaint Chief Complaint  Patient presents with  . Shoulder Pain  . Diarrhea  . Back Pain    HPI Katrina Oconnor is a 47 y.o. female.   Katrina Oconnor presents with multiple complaints. Left sided low back pain which started 1 week ago, radiates into buttocks. Mild right sided back pain as well. Worse with movement. Has had similar in the past, feels it is related to sciatica. No injury. No numbness tingling or weakness to lower extremities. No urinary or stool incontinence. Also complaints of right shoulder which radiates to collar bone. Worse with movement and worse at night. No known injury. Started a week ago. No numbness or tingling to arm or hand. She is left handed. Also complaints of nausea and diarrhea which started after eating pizza out 8/20. States has loose stool each time she urinates. States today the stool has somewhat decreased however. Still has had normal urination. Just completed her period. No known ill contacts. No recent travel. No recent antibiotics. Took alka seltzer today which did not help. Took advil earlier this week which did not help. Denies current abdominal pain. Has intermittent cramping. States hasn't wanted to eat in the past two days due to fear of discomfort. Has not vomited. Still drinking fluids. No specific known fevers but states felt chilled today. Hx of arthritis, gerd. Colonoscopy last in 2015.    ROS per HPI.      Past Medical History:  Diagnosis Date  . Arthritis    both legs sciatic nerve pain at times  . Blood in stool    since april 2015, found microscopic  . GERD (gastroesophageal reflux disease)     There are no active problems to display for this patient.   Past Surgical History:  Procedure Laterality Date  . CESAREAN SECTION     x 2  . COLONOSCOPY WITH PROPOFOL N/A 05/06/2014   Procedure: COLONOSCOPY WITH PROPOFOL;  Surgeon: Charolett Bumpers, MD;  Location: [facility];  Service: Endoscopy;  Laterality: N/A;  . ESOPHAGOGASTRODUODENOSCOPY (EGD) WITH PROPOFOL N/A 05/06/2014   Procedure: ESOPHAGOGASTRODUODENOSCOPY (EGD) WITH PROPOFOL;  Surgeon: Charolett Bumpers, MD;  Location: [facility];  Service: Endoscopy;  Laterality: N/A;    OB History   None      Home Medications    Prior to Admission medications   Medication Sig Start Date End Date Taking? Authorizing Provider  ferrous sulfate 325 (65 FE) MG tablet Take 650 mg by mouth daily with breakfast.   Yes [provider]  acetaminophen (TYLENOL) 500 MG tablet Take 1 tablet (500 mg total) by mouth every 6 (six) hours as needed. 06/14/18   Georgetta Haber, NP  cyclobenzaprine (FLEXERIL) 5 MG tablet Take 1 tablet (5 mg total) by mouth at bedtime. 06/14/18   Georgetta Haber, NP  ibuprofen (ADVIL,MOTRIN) 200 MG tablet Take 200-400 mg by mouth every 6 (six) hours as needed for mild pain or moderate pain.     [provider]  loperamide (IMODIUM) 2 MG capsule Take 1 capsule (2 mg total) by mouth 4 (four) times daily as needed for diarrhea or loose stools. 06/14/18   Georgetta Haber, NP  naproxen (NAPROSYN) 500 MG tablet Take 1 tablet (500 mg total) by mouth 2 (two) times daily. 06/14/18   Georgetta Haber, NP  ondansetron (ZOFRAN) 4 MG tablet Take 1 tablet (  4 mg total) by mouth every 8 (eight) hours as needed for nausea or vomiting. 06/14/18   Linus Mako B, NP  potassium chloride SA (K-DUR,KLOR-CON) 20 MEQ tablet Take 1 tablet (20 mEq total) by mouth daily for 3 days. 06/14/18 06/17/18  Georgetta Haber, NP  Vitamin D, Ergocalciferol, (DRISDOL) 50000 UNITS CAPS capsule Take 50,000 Units by mouth every 7 (seven) days.    [provider]    Family History No family history on file.  Social History Social History   Tobacco Use  . Smoking status: Never Smoker  . Smokeless tobacco: Never Used  Substance Use Topics  . Alcohol use: No  . Drug use:  No     Allergies   Penicillins   Review of Systems Review of Systems   Physical Exam Triage Vital Signs ED Triage Vitals [06/14/18 1945]  Enc Vitals Group     BP 137/84     Pulse Rate (!) 116     Resp 16     Temp 100.3 F (37.9 C)     Temp Source Oral     SpO2 96 %     Weight      Height      Head Circumference      Peak Flow      Pain Score 7     Pain Loc      Pain Edu?      Excl. in GC?    No data found.  Updated Vital Signs BP 137/84   Pulse (!) 116   Temp 100.3 F (37.9 C) (Oral)   Resp 16   LMP 06/09/2018   SpO2 96%    Physical Exam  Constitutional: She is oriented to person, place, and time. She appears well-developed and well-nourished. She appears ill. No distress.  HENT:  Head: Normocephalic and atraumatic.  Cardiovascular: Regular rhythm and normal heart sounds. Tachycardia present.  Pulmonary/Chest: Effort normal and breath sounds normal.  Abdominal: Soft. Bowel sounds are normal. There is no tenderness. There is no rigidity, no rebound, no guarding, no CVA tenderness and no tenderness at McBurney's point.  Musculoskeletal:       Right shoulder: She exhibits decreased range of motion, tenderness and pain. She exhibits no bony tenderness, no swelling, no effusion, no crepitus, no deformity, no laceration, no spasm, normal pulse and normal strength.       Lumbar back: She exhibits tenderness and pain. She exhibits normal range of motion, no bony tenderness, no swelling, no edema, no deformity, no laceration, no spasm and normal pulse.       Back:  Medial anterior shoulder with tenderness on palpation; pain with over head arc; mild pain with ac compression; no pain with external rotation on exam but patient states she has been unable to wipe after using the restroom due to pain; gross sensation to bilateral upper extremities intact, strong radial pulse; left low back pain with left hip flexion and straight leg raise; pain into buttocks; strength equal  bilaterally; gross sensation intact to lower extremities  Neurological: She is alert and oriented to person, place, and time.  Skin: Skin is warm and dry.     UC Treatments / Results  Labs (all labs ordered are listed, but only abnormal results are displayed) Labs Reviewed  CBC - Abnormal; Notable for the following components:      Result Value   Hemoglobin 10.0 (*)    HCT 33.3 (*)    MCV 76.9 (*)  MCH 23.1 (*)    RDW 15.6 (*)    Platelets 404 (*)    All other components within normal limits  POCT I-STAT, CHEM 8 - Abnormal; Notable for the following components:   Potassium 3.2 (*)    BUN <3 (*)    Calcium, Ion 1.13 (*)    Hemoglobin 11.2 (*)    HCT 33.0 (*)    All other components within normal limits  POCT URINALYSIS DIP (DEVICE) - Abnormal; Notable for the following components:   Bilirubin Urine SMALL (*)    Ketones, ur 40 (*)    Protein, ur 30 (*)    All other components within normal limits  GASTROINTESTINAL PANEL BY PCR, STOOL (REPLACES STOOL CULTURE)  COMPREHENSIVE METABOLIC PANEL  LIPASE, BLOOD    EKG None  Radiology No results found.  Procedures Procedures (including critical care time)  Medications Ordered in UC Medications  ketorolac (TORADOL) injection 60 mg (60 mg Intramuscular Not Given 06/14/18 2025)  ondansetron (ZOFRAN-ODT) disintegrating tablet 4 mg (4 mg Oral Given 06/14/18 2025)    Initial Impression / Assessment and Plan / UC Course  I have reviewed the triage vital signs and the nursing notes.  Pertinent labs & imaging results that were available during my care of the patient were reviewed by me and considered in my medical decision making (see chart for details).     Urine concerning for dehydration, consistent with mild tachycardia. Noted low grade temp. Diarrhea which has improved today but ongoing for >1 week. No pain on abdominal exam. Ambulatory but with pain. Patient declines toradol in clinic, declines iv fluids. States would like  to take po once home. Entire cup of water taken in while in clinic. Imodium, zofran. Push fluids. Back and shoulder pain treatment provided. Potassium replacement provided. Strict return precautions provided to follow in the Er. Labs still pending. Will notify patient of any concerning results. Patient verbalized understanding and agreeable to plan.   Final Clinical Impressions(s) / UC Diagnoses   Final diagnoses:  Diarrhea, unspecified type  Dehydration  Tendinitis of right shoulder  Acute bilateral low back pain with left-sided sciatica     Discharge Instructions     I am concerned about your temp and heart rate today.  Your urine and labs which resulted today do indicate dehydration. Please increase your fluid intake, especially while having diarrhea.  Small frequent sips of fluids- Pedialyte, Gatorade, water, broth- to maintain hydration.   Tylenol regularly to help with pain and fevers.  Imodium to help with diarrhea.  Zofran as needed for nausea. If still with diarrhea you can bring back a sample which we can test for bacteria.  Naproxen twice a day to help with shoulder and back pain. Take with food. Bland diet as tolerated.  Muscle relaxer at night. May cause drowsiness. Please do not take if driving or drinking alcohol.   You may need further evaluation and treatment if your symptoms are not improving or if they are worsening. If any worsening of pain, fevers, weakness, dehydration please go to the Er. I have additional labs pending and would call you if these return abnormal.      ED Prescriptions    Medication Sig Dispense Auth. Provider   naproxen (NAPROSYN) 500 MG tablet Take 1 tablet (500 mg total) by mouth 2 (two) times daily. 30 tablet Linus MakoBurky, Arlee Santosuosso B, NP   cyclobenzaprine (FLEXERIL) 5 MG tablet Take 1 tablet (5 mg total) by mouth at bedtime. 15 tablet CoinjockBurky,  Barron Alvine, NP   acetaminophen (TYLENOL) 500 MG tablet Take 1 tablet (500 mg total) by mouth every 6 (six) hours  as needed. 30 tablet Katanya Schlie, Dorene Grebe B, NP   ondansetron (ZOFRAN) 4 MG tablet Take 1 tablet (4 mg total) by mouth every 8 (eight) hours as needed for nausea or vomiting. 10 tablet Linus Mako B, NP   loperamide (IMODIUM) 2 MG capsule Take 1 capsule (2 mg total) by mouth 4 (four) times daily as needed for diarrhea or loose stools. 12 capsule Linus Mako B, NP   potassium chloride SA (K-DUR,KLOR-CON) 20 MEQ tablet Take 1 tablet (20 mEq total) by mouth daily for 3 days. 3 tablet Georgetta Haber, NP     Controlled Substance Prescriptions [place] Controlled Substance Registry consulted? Not Applicable   Georgetta Haber, NP 06/14/18 2101

## 2018-06-21 ENCOUNTER — Inpatient Hospital Stay (HOSPITAL_COMMUNITY)
Admission: EM | Admit: 2018-06-21 | Discharge: 2018-06-28 | DRG: 386 | Disposition: A | Discharge status: Home / Self Care | Payer: Managed Care, Other (non HMO) | Attending: Internal Medicine | Admitting: Internal Medicine

## 2018-06-21 ENCOUNTER — Emergency Department (HOSPITAL_COMMUNITY): Payer: Managed Care, Other (non HMO)

## 2018-06-21 ENCOUNTER — Encounter (HOSPITAL_COMMUNITY): Payer: Self-pay | Admitting: Emergency Medicine

## 2018-06-21 DIAGNOSIS — R112 Nausea with vomiting, unspecified: Secondary | ICD-10-CM | POA: Diagnosis present

## 2018-06-21 DIAGNOSIS — R9431 Abnormal electrocardiogram [ECG] [EKG]: Secondary | ICD-10-CM | POA: Diagnosis present

## 2018-06-21 DIAGNOSIS — Z791 Long term (current) use of non-steroidal anti-inflammatories (NSAID): Secondary | ICD-10-CM

## 2018-06-21 DIAGNOSIS — D500 Iron deficiency anemia secondary to blood loss (chronic): Secondary | ICD-10-CM | POA: Diagnosis present

## 2018-06-21 DIAGNOSIS — R197 Diarrhea, unspecified: Secondary | ICD-10-CM | POA: Diagnosis not present

## 2018-06-21 DIAGNOSIS — M25511 Pain in right shoulder: Secondary | ICD-10-CM | POA: Diagnosis present

## 2018-06-21 DIAGNOSIS — K921 Melena: Secondary | ICD-10-CM | POA: Diagnosis present

## 2018-06-21 DIAGNOSIS — R634 Abnormal weight loss: Secondary | ICD-10-CM | POA: Diagnosis present

## 2018-06-21 DIAGNOSIS — K51911 Ulcerative colitis, unspecified with rectal bleeding: Secondary | ICD-10-CM

## 2018-06-21 DIAGNOSIS — K529 Noninfective gastroenteritis and colitis, unspecified: Secondary | ICD-10-CM

## 2018-06-21 DIAGNOSIS — E669 Obesity, unspecified: Secondary | ICD-10-CM | POA: Diagnosis present

## 2018-06-21 DIAGNOSIS — M5440 Lumbago with sciatica, unspecified side: Secondary | ICD-10-CM | POA: Diagnosis present

## 2018-06-21 DIAGNOSIS — K5190 Ulcerative colitis, unspecified, without complications: Secondary | ICD-10-CM | POA: Diagnosis not present

## 2018-06-21 DIAGNOSIS — E876 Hypokalemia: Secondary | ICD-10-CM | POA: Diagnosis present

## 2018-06-21 DIAGNOSIS — R000 Tachycardia, unspecified: Secondary | ICD-10-CM | POA: Diagnosis present

## 2018-06-21 DIAGNOSIS — K219 Gastro-esophageal reflux disease without esophagitis: Secondary | ICD-10-CM | POA: Diagnosis present

## 2018-06-21 DIAGNOSIS — Z98891 History of uterine scar from previous surgery: Secondary | ICD-10-CM

## 2018-06-21 DIAGNOSIS — Z6839 Body mass index (BMI) 39.0-39.9, adult: Secondary | ICD-10-CM

## 2018-06-21 DIAGNOSIS — D509 Iron deficiency anemia, unspecified: Secondary | ICD-10-CM | POA: Diagnosis present

## 2018-06-21 DIAGNOSIS — M1990 Unspecified osteoarthritis, unspecified site: Secondary | ICD-10-CM | POA: Diagnosis present

## 2018-06-21 DIAGNOSIS — Z79899 Other long term (current) drug therapy: Secondary | ICD-10-CM

## 2018-06-21 DIAGNOSIS — E869 Volume depletion, unspecified: Secondary | ICD-10-CM | POA: Diagnosis present

## 2018-06-21 DIAGNOSIS — Z880 Allergy status to penicillin: Secondary | ICD-10-CM

## 2018-06-21 LAB — URINALYSIS, ROUTINE W REFLEX MICROSCOPIC
Glucose, UA: NEGATIVE mg/dL
Ketones, ur: 80 mg/dL — AB
Leukocytes, UA: NEGATIVE
Nitrite: NEGATIVE
Protein, ur: 100 mg/dL — AB
Specific Gravity, Urine: 1.025 (ref 1.005–1.030)
pH: 5 (ref 5.0–8.0)

## 2018-06-21 LAB — CBC
HCT: 34.4 % — ABNORMAL LOW (ref 36.0–46.0)
Hemoglobin: 10.4 g/dL — ABNORMAL LOW (ref 12.0–15.0)
MCH: 23 pg — ABNORMAL LOW (ref 26.0–34.0)
MCHC: 30.2 g/dL (ref 30.0–36.0)
MCV: 75.9 fL — ABNORMAL LOW (ref 78.0–100.0)
Platelets: 437 10*3/uL — ABNORMAL HIGH (ref 150–400)
RBC: 4.53 MIL/uL (ref 3.87–5.11)
RDW: 15.9 % — ABNORMAL HIGH (ref 11.5–15.5)
WBC: 10.4 10*3/uL (ref 4.0–10.5)

## 2018-06-21 LAB — COMPREHENSIVE METABOLIC PANEL
ALT: 12 U/L (ref 0–44)
AST: 13 U/L — ABNORMAL LOW (ref 15–41)
Albumin: 2.4 g/dL — ABNORMAL LOW (ref 3.5–5.0)
Alkaline Phosphatase: 114 U/L (ref 38–126)
Anion gap: 19 — ABNORMAL HIGH (ref 5–15)
BUN: <5 mg/dL — ABNORMAL LOW (ref 6–20)
CO2: 22 mmol/L (ref 22–32)
Calcium: 8.9 mg/dL (ref 8.9–10.3)
Chloride: 96 mmol/L — ABNORMAL LOW (ref 98–111)
Creatinine, Ser: 0.87 mg/dL (ref 0.44–1.00)
GFR calc Af Amer: >60 mL/min (ref >60)
GFR calc non Af Amer: >60 mL/min (ref >60)
Glucose, Bld: 81 mg/dL (ref 70–99)
Potassium: 3.2 mmol/L — ABNORMAL LOW (ref 3.5–5.1)
Sodium: 137 mmol/L (ref 135–145)
Total Bilirubin: 1.2 mg/dL (ref 0.3–1.2)
Total Protein: 7.6 g/dL (ref 6.5–8.1)

## 2018-06-21 LAB — I-STAT BETA HCG BLOOD, ED (MC, WL, AP ONLY): I-stat hCG, quantitative: <5 m[IU]/mL (ref <5)

## 2018-06-21 LAB — TYPE AND SCREEN
ABO/RH(D): O POS
Antibody Screen: NEGATIVE

## 2018-06-21 LAB — ABO/RH: ABO/RH(D): O POS

## 2018-06-21 LAB — I-STAT CG4 LACTIC ACID, ED: LACTIC ACID, VENOUS: 0.89 mmol/L (ref 0.5–1.9)

## 2018-06-21 LAB — LIPASE, BLOOD: Lipase: 22 U/L (ref 11–51)

## 2018-06-21 LAB — I-STAT TROPONIN, ED: Troponin i, poc: 0.01 ng/mL (ref 0.00–0.08)

## 2018-06-21 MED ORDER — ONDANSETRON HCL 4 MG/2ML IJ SOLN
4.0000 mg | Freq: Once | INTRAMUSCULAR | Status: AC
  Administered 2018-06-21: 4 mg via INTRAVENOUS
  Filled 2018-06-21: qty 2

## 2018-06-21 MED ORDER — SODIUM CHLORIDE 0.9 % IV BOLUS
2000.0000 mL | Freq: Once | INTRAVENOUS | Status: AC
  Administered 2018-06-21: 2000 mL via INTRAVENOUS

## 2018-06-21 NOTE — ED Provider Notes (Signed)
Patient placed in Quick Look pathway, seen and evaluated   Chief Complaint: fast HR  HPI:   3 weeks of looser stools with streaks of bright red blood, nausea, vomiting after meals, and weight loss of 13#. Was seen at UC 1 week ago. Went to [facility] walk in and advised to come to ER for fast HR.   ROS: positive: generalized weakness, intermittent right shoulder pain that radiates into chest.    Physical Exam:   Gen: No distress  Neuro: Awake and Alert  Skin: Warm    Focused Exam: HR 130s, regular. Lungs CTAB. Diffuse abd tenderness (pt states it's because she is hungry).   Initiation of care has begun. The patient has been counseled on the process, plan, and necessity for staying for the completion/evaluation, and the remainder of the medical screening examination    Jerrell Mylar 06/21/18 Areatha Keas, MD 06/21/18 [phone]

## 2018-06-21 NOTE — ED Triage Notes (Signed)
Pt presents with multiple complaints. Pt sates she has had diarrhea, nausea, blood in stool, gen weakness, R shoulder pain with radiation across chest X3 weeks. Pt states she was seen at [facility] 1 week ago for same and told to come to ED if symptoms have not improved. Pt tachycardic in triage, HR 130's, low grade fever.

## 2018-06-21 NOTE — ED Provider Notes (Signed)
[facility] EMERGENCY DEPARTMENT Provider Note   CSN: [id number] Arrival date & time: 06/21/18  1844     History   Chief Complaint Chief Complaint  Patient presents with  . Tachycardia    HPI Katrina Oconnor is a 47 y.o. female with a hx of GERD, sciatica, anemia (not currently on iron supplements) presents to the Emergency Department complaining of gradual, persistent, progressively worsening nausea, vomiting and diarrhea onset approx 3 weeks ago.  Pt reports she ate pizza on 06/05/18 and afterwards developed nausea, vomiting and right shoulder pain.  She reports since that time she has had intermittent vomiting every few days and daily diarrhea.  Pt reports watery diarrhea each time she urinates.  She reports diarrhea is brown, but she notices streaks of bright red blood on the toilet paper when wiping.  Pt reports she has continued to drink fluids, but is not eating food for fear of severe nausea.  She reports significant increase in nausea after eating, but no specific pain.  Pt denies sick contacts, international travel, recent antibiotic usage.  She reports generalized fatigue and 14 lb weight loss in the last 3 weeks.  She has a hx of c-section, but no other abd surgeries. Nothing makes the symptoms better.  Patient denies alcohol usage, cigarette usage or illicit drug usage.  Specifically she denies marijuana or cocaine usage.  Pt reports associated pain in her right shoulder has moved to a more anterior location and now radiates into her chest.  She reports it is worse with movement, but nothing specific makes it better.  She denies SOB, DOE or other symptoms.  No falls or known injuries.   Pt was evaluated at [facility] walk in clinic today and sent to the ER for tachycardia.  Record review shows pt was evaluated at [facility] on 06/14/18.  She appeared dehydrated at that time, but declined toradol or IV fluids.  She was able to tolerate PO during that visit.  Pt was c/o low back pain  consistent with sciatica at that visit and was prescribed naprosyn.  She reports taking this for the last week with a few bites of crackers.  She was not taking an NSAID prior to this.  Pt reports low back pain has improved, but has not resolved.     The history is provided by the patient and medical records. No language interpreter was used.    Past Medical History:  Diagnosis Date  . Arthritis    both legs sciatic nerve pain at times  . Blood in stool    since april 2015, found microscopic  . GERD (gastroesophageal reflux disease)     There are no active problems to display for this patient.   Past Surgical History:  Procedure Laterality Date  . CESAREAN SECTION     x 2  . COLONOSCOPY WITH PROPOFOL N/A 05/06/2014   Procedure: COLONOSCOPY WITH PROPOFOL;  Surgeon: Charolett Bumpers, MD;  Location: [facility] ENDOSCOPY;  Service: Endoscopy;  Laterality: N/A;  . ESOPHAGOGASTRODUODENOSCOPY (EGD) WITH PROPOFOL N/A 05/06/2014   Procedure: ESOPHAGOGASTRODUODENOSCOPY (EGD) WITH PROPOFOL;  Surgeon: Charolett Bumpers, MD;  Location: [facility] ENDOSCOPY;  Service: Endoscopy;  Laterality: N/A;     OB History   None      Home Medications    Prior to Admission medications   Medication Sig Start Date End Date Taking? Authorizing Provider  cyclobenzaprine (FLEXERIL) 5 MG tablet Take 1 tablet (5 mg total) by mouth at bedtime. 06/14/18  Yes  Georgetta Haber, NP  loperamide (IMODIUM) 2 MG capsule Take 1 capsule (2 mg total) by mouth 4 (four) times daily as needed for diarrhea or loose stools. 06/14/18  Yes Burky, Dorene Grebe B, NP  naproxen (NAPROSYN) 500 MG tablet Take 1 tablet (500 mg total) by mouth 2 (two) times daily. 06/14/18  Yes Linus Mako B, NP  acetaminophen (TYLENOL) 500 MG tablet Take 1 tablet (500 mg total) by mouth every 6 (six) hours as needed. Patient not taking: Reported on 06/22/2018 06/14/18   Linus Mako B, NP  ondansetron (ZOFRAN) 4 MG tablet Take 1 tablet (4 mg total) by mouth every 8  (eight) hours as needed for nausea or vomiting. 06/14/18   Georgetta Haber, NP  potassium chloride SA (K-DUR,KLOR-CON) 20 MEQ tablet Take 1 tablet (20 mEq total) by mouth daily for 3 days. Patient not taking: Reported on 06/22/2018 06/14/18 06/22/26  Georgetta Haber, NP    Family History No family history on file.  Social History Social History   Tobacco Use  . Smoking status: Never Smoker  . Smokeless tobacco: Never Used  Substance Use Topics  . Alcohol use: No  . Drug use: No     Allergies   Penicillins   Review of Systems Review of Systems  Constitutional: Positive for fatigue and unexpected weight change. Negative for appetite change, diaphoresis and fever.  HENT: Negative for mouth sores.   Eyes: Negative for visual disturbance.  Respiratory: Negative for cough, chest tightness, shortness of breath and wheezing.   Cardiovascular: Negative for chest pain.  Gastrointestinal: Positive for abdominal pain, diarrhea, nausea and vomiting. Negative for constipation.  Endocrine: Negative for polydipsia, polyphagia and polyuria.  Genitourinary: Negative for dysuria, frequency, hematuria and urgency.  Musculoskeletal: Negative for back pain and neck stiffness.  Skin: Negative for rash.  Allergic/Immunologic: Negative for immunocompromised state.  Neurological: Negative for syncope, light-headedness and headaches.  Hematological: Does not bruise/bleed easily.  Psychiatric/Behavioral: Negative for sleep disturbance. The patient is not nervous/anxious.      Physical Exam Updated Vital Signs BP 114/67 (BP Location: Right Arm)   Pulse (!) 128   Temp 99.3 F (37.4 C) (Oral)   Resp 16   Ht 4\' 10"  (1.473 m)   Wt 83.9 kg   LMP 06/09/2018   SpO2 100%   BMI 38.67 kg/m   Physical Exam  Constitutional: She appears well-developed and well-nourished. No distress.  Awake, alert,  Uncomfortable appearing  HENT:  Head: Normocephalic and atraumatic.  Mouth/Throat: Mucous membranes  are pale and dry. No oropharyngeal exudate.  Eyes: Conjunctivae are normal. No scleral icterus.  Neck: Normal range of motion. Neck supple.  Cardiovascular: Regular rhythm and intact distal pulses. Tachycardia present.  Pulses:      Radial pulses are 2+ on the right side, and 2+ on the left side.  Pulmonary/Chest: Effort normal and breath sounds normal. No accessory muscle usage. Tachypnea noted. No respiratory distress. She has no decreased breath sounds. She has no wheezes.  Equal chest expansion  Abdominal: Soft. Bowel sounds are normal. She exhibits no mass. There is generalized tenderness. There is no rigidity, no rebound, no guarding and no CVA tenderness.  Abd is generally tender throughout  Musculoskeletal: Normal range of motion. She exhibits no edema.  Neurological: She is alert.  Speech is clear and goal oriented Moves extremities without ataxia  Skin: Skin is warm and dry. She is not diaphoretic. There is pallor.  Psychiatric: She has a normal mood and affect.  Nursing note and vitals reviewed.    ED Treatments / Results  Labs (all labs ordered are listed, but only abnormal results are displayed) Labs Reviewed  COMPREHENSIVE METABOLIC PANEL - Abnormal; Notable for the following components:      Result Value   Potassium 3.2 (*)    Chloride 96 (*)    BUN <5 (*)    Albumin 2.4 (*)    AST 13 (*)    Anion gap 19 (*)    All other components within normal limits  CBC - Abnormal; Notable for the following components:   Hemoglobin 10.4 (*)    HCT 34.4 (*)    MCV 75.9 (*)    MCH 23.0 (*)    RDW 15.9 (*)    Platelets 437 (*)    All other components within normal limits  URINALYSIS, ROUTINE W REFLEX MICROSCOPIC - Abnormal; Notable for the following components:   Color, Urine AMBER (*)    APPearance HAZY (*)    Hgb urine dipstick MODERATE (*)    Bilirubin Urine MODERATE (*)    Ketones, ur 80 (*)    Protein, ur 100 (*)    Bacteria, UA RARE (*)    All other components  within normal limits  GASTROINTESTINAL PANEL BY PCR, STOOL (REPLACES STOOL CULTURE)  OVA + PARASITE EXAM  LIPASE, BLOOD  RAPID URINE DRUG SCREEN, HOSP PERFORMED  MAGNESIUM  I-STAT BETA HCG BLOOD, ED ([facility], [facility], [facility] ONLY)  I-STAT TROPONIN, ED  I-STAT CG4 LACTIC ACID, ED  I-STAT CG4 LACTIC ACID, ED  TYPE AND SCREEN  ABO/RH    EKG EKG Interpretation  Date/Time:  Thursday June 21 2018 18:51:00 EDT Ventricular Rate:  141 PR Interval:  124 QRS Duration: 82 QT Interval:  298 QTC Calculation: 456 R Axis:   99 Text Interpretation:  Sinus tachycardia Rightward axis T wave abnormality, consider inferior ischemia Abnormal ECG No old tracing to compare Confirmed by Rolan Bucco [phone]) on 06/21/2018 10:37:00 PM   Radiology Ct Angio Chest Pe W And/or Wo Contrast  Result Date: 06/22/2018 CLINICAL DATA:  Tachycardia with right shoulder and chest pain. EXAM: CT ANGIOGRAPHY CHEST WITH CONTRAST TECHNIQUE: Multidetector CT imaging of the chest was performed using the standard protocol during bolus administration of intravenous contrast. Multiplanar CT image reconstructions and MIPs were obtained to evaluate the vascular anatomy. CONTRAST:  ISOVUE-370 IOPAMIDOL (ISOVUE-370) INJECTION 76% COMPARISON:  None. FINDINGS: Cardiovascular: Motion artifact limits examination. Good opacification of the central and segmental pulmonary arteries. No focal filling defects. No evidence of significant pulmonary embolus. Normal heart size. No pericardial effusion. Normal caliber thoracic aorta. No dissection. Great vessel origins are patent. Mild aortic calcification. Mediastinum/Nodes: Esophagus is decompressed. No significant lymphadenopathy in the chest. Thyroid gland is unremarkable. Lungs/Pleura: Motion artifact limits evaluation. No airspace disease or consolidation is appreciated. No pleural effusions. No pneumothorax. Airways are patent. Upper Abdomen: No acute abnormality. Musculoskeletal: No chest wall  abnormality. No acute or significant osseous findings. Review of the MIP images confirms the above findings. IMPRESSION: No evidence of significant pulmonary embolus. No evidence of active pulmonary disease. Electronically Signed   By: Burman Nieves M.D.   On: 06/22/2018 01:27   US Abdomen Limited  Result Date: 06/21/2018 CLINICAL DATA:  Initial evaluation for acute abdominal pain with right-sided chest pain, nausea, vomiting. EXAM: ULTRASOUND ABDOMEN LIMITED RIGHT UPPER QUADRANT COMPARISON:  None. FINDINGS: Gallbladder: No gallstones or wall thickening visualized. No sonographic Murphy sign noted by sonographer. Common bile duct: Diameter: 3.4 mm  Liver: No focal lesion identified. Within normal limits in parenchymal echogenicity. Portal vein is patent on color Doppler imaging with normal direction of blood flow towards the liver. IMPRESSION: Normal right upper quadrant ultrasound. Electronically Signed   By: Rise Mu M.D.   On: 06/21/2018 23:41   Dg Abd Acute W/chest  Result Date: 06/21/2018 CLINICAL DATA:  Initial evaluation for acute abdominal pain, right chest pain, nausea, vomiting. EXAM: DG ABDOMEN ACUTE W/ 1V CHEST COMPARISON:  Prior radiograph from 12/11/2013. FINDINGS: Cardiac and mediastinal silhouettes within normal limits. Mild aortic atherosclerosis. Lungs normally inflated. Minimal bibasilar subsegmental atelectatic changes. No focal infiltrates. No pulmonary edema or pleural effusion. No pneumothorax. Bowel gas pattern within normal limits without obstruction or ileus. No abnormal bowel wall thickening. No free air. No soft tissue mass or abnormal calcification. Visualized osseous structures within normal limits. IMPRESSION: 1. Nonobstructive bowel gas pattern with no radiographic evidence for acute intra-abdominal process. 2. Mild bibasilar subsegmental atelectasis. No other active cardiopulmonary disease. 3. Mild aortic atherosclerosis. Electronically Signed   By: Rise Mu M.D.   On: 06/21/2018 23:53    Procedures Procedures (including critical care time)  Medications Ordered in ED Medications  magnesium sulfate IVPB 2 g 50 mL (has no administration in time range)  sodium chloride 0.9 % bolus 500 mL (has no administration in time range)  0.9 %  sodium chloride infusion (has no administration in time range)  sodium chloride 0.9 % bolus 2,000 mL (0 mLs Intravenous Stopped 06/22/18 0200)  ondansetron (ZOFRAN) injection 4 mg (4 mg Intravenous Given 06/21/18 2350)  iopamidol (ISOVUE-370) 76 % injection 100 mL (100 mLs Intravenous Contrast Given 06/22/18 0108)  sodium chloride 0.9 % bolus 1,000 mL (0 mLs Intravenous Stopped 06/22/18 0338)  potassium chloride 10 mEq in 100 mL IVPB (0 mEq Intravenous Stopped 06/22/18 0338)  metoCLOPramide (REGLAN) injection 10 mg (10 mg Intravenous Given 06/22/18 0227)     Initial Impression / Assessment and Plan / ED Course  I have reviewed the triage vital signs and the nursing notes.  Pertinent labs & imaging results that were available during my care of the patient were reviewed by me and considered in my medical decision making (see chart for details).  Clinical Course as of Jun 22 406  Thu Jun 21, 2018  2208 Hypokalemia  Potassium(!): 3.2 [HM]  2208 Anemia - at baseline  Hemoglobin(!): 10.4 [HM]  2208 elevated  Anion gap(!): 19 [HM]  2208 neg  Troponin i, poc: 0.01 [HM]  2208 [place] and protein in urine without evidence of UTI  Ketones, ur(!): 80 [HM]  2209 Tachycardic  Pulse Rate(!): 128 [HM]  2209 No hypotension  BP: 114/67 [HM]  2209 noted  Temp: 99.3 F (37.4 C) [HM]  Fri Jun 22, 2018  0139 Persistent tachycardia  Pulse Rate(!): 114 [HM]  0140 Tachypnea  Resp(!): 24 [HM]  0348 With one episode of emesis here in the emergency department.   [HM]  [id number] Patient remains persistently tachycardic despite 3 L of fluid.  Additionally, she remains tachypneic.  She reports her pain is under control.  Pulse  Rate(!): 122 [HM]  0406 Discussed with Dr. Clyde Lundborg who will admit.   [HM]    Clinical Course User Index [HM] Lorenza Winkleman, Dahlia Client, PA-C    Patient presents to the emergency department with 3 weeks of diarrhea.  She is persistently tachycardic and tachypneic despite 3 L of fluid here in the emergency department.  She has had no international travel, no recent  antibiotics, no camping trips.  She has been afebrile throughout her time here in the emergency department including a rectal temperature.  Her white blood cell count is normal.  Her lactic acid is normal.  Patient's abdomen is mildly tender but is not distended and has no rebound or guarding.  Chest x-ray is without evidence of fluid overload.  Acute abdominal series is without evidence of bowel perforation or free air.  CT scan of her chest is without pulmonary embolism.  Troponin is negative and there is no clinical evidence of myocarditis or pericarditis.  EKG has no evidence of STEMI and I doubt ACS.  Patient does have ketones in her urine.  She is not hypotensive.  There is no evidence of sepsis. Mild hypokalemia.  Potassium and magnesium given in the ED.  At this time, I do not have an answer for patient's persistent tachycardia and tachypnea.  She will need admission for further hydration and further investigation of her persistently abnormal vital signs.  The patient was discussed with and seen by Dr. Eudelia Bunch who agrees with the treatment plan.   Final Clinical Impressions(s) / ED Diagnoses   Final diagnoses:  Tachycardia  Hypokalemia  Diarrhea of presumed infectious origin    ED Discharge Orders    None       Mardene Sayer Boyd Kerbs 06/22/18 0407    Nira Conn, MD 06/22/18 [phone]

## 2018-06-21 NOTE — ED Notes (Signed)
Previous RN attempted IV x2, without success. Pt currently in Xray, Ultrasound.

## 2018-06-22 ENCOUNTER — Other Ambulatory Visit: Payer: Self-pay

## 2018-06-22 ENCOUNTER — Emergency Department (HOSPITAL_COMMUNITY): Payer: Managed Care, Other (non HMO)

## 2018-06-22 DIAGNOSIS — E876 Hypokalemia: Secondary | ICD-10-CM | POA: Diagnosis not present

## 2018-06-22 DIAGNOSIS — E869 Volume depletion, unspecified: Secondary | ICD-10-CM

## 2018-06-22 DIAGNOSIS — R112 Nausea with vomiting, unspecified: Secondary | ICD-10-CM | POA: Diagnosis present

## 2018-06-22 DIAGNOSIS — R000 Tachycardia, unspecified: Secondary | ICD-10-CM

## 2018-06-22 DIAGNOSIS — K529 Noninfective gastroenteritis and colitis, unspecified: Secondary | ICD-10-CM | POA: Insufficient documentation

## 2018-06-22 DIAGNOSIS — R197 Diarrhea, unspecified: Secondary | ICD-10-CM

## 2018-06-22 LAB — RENAL FUNCTION PANEL
Albumin: 2 g/dL — ABNORMAL LOW (ref 3.5–5.0)
Albumin: 1.9 g/dL — ABNORMAL LOW (ref 3.5–5.0)
Anion gap: 11 (ref 5–15)
Anion gap: 10 (ref 5–15)
BUN: <5 mg/dL — ABNORMAL LOW (ref 6–20)
BUN: <5 mg/dL — ABNORMAL LOW (ref 6–20)
CO2: 20 mmol/L — ABNORMAL LOW (ref 22–32)
CO2: 20 mmol/L — ABNORMAL LOW (ref 22–32)
Calcium: 8.1 mg/dL — ABNORMAL LOW (ref 8.9–10.3)
Calcium: 7.8 mg/dL — ABNORMAL LOW (ref 8.9–10.3)
Chloride: 109 mmol/L (ref 98–111)
Chloride: 108 mmol/L (ref 98–111)
Creatinine, Ser: 0.66 mg/dL (ref 0.44–1.00)
Creatinine, Ser: 0.62 mg/dL (ref 0.44–1.00)
GFR calc Af Amer: >60 mL/min (ref >60)
GFR calc Af Amer: >60 mL/min (ref >60)
GFR calc non Af Amer: >60 mL/min (ref >60)
GFR calc non Af Amer: >60 mL/min (ref >60)
Glucose, Bld: 96 mg/dL (ref 70–99)
Glucose, Bld: 88 mg/dL (ref 70–99)
Phosphorus: 1.5 mg/dL — ABNORMAL LOW (ref 2.5–4.6)
Phosphorus: 1.7 mg/dL — ABNORMAL LOW (ref 2.5–4.6)
Potassium: 3.7 mmol/L (ref 3.5–5.1)
Potassium: 4.1 mmol/L (ref 3.5–5.1)
Sodium: 140 mmol/L (ref 135–145)
Sodium: 138 mmol/L (ref 135–145)

## 2018-06-22 LAB — RAPID URINE DRUG SCREEN, HOSP PERFORMED
Amphetamines: NOT DETECTED
BARBITURATES: NOT DETECTED
Benzodiazepines: NOT DETECTED
COCAINE: NOT DETECTED
Opiates: NOT DETECTED
TETRAHYDROCANNABINOL: NOT DETECTED

## 2018-06-22 LAB — CBC
HEMATOCRIT: 32.7 % — AB (ref 36.0–46.0)
HEMOGLOBIN: 9.8 g/dL — AB (ref 12.0–15.0)
MCH: 23.1 pg — ABNORMAL LOW (ref 26.0–34.0)
MCHC: 30 g/dL (ref 30.0–36.0)
MCV: 77.1 fL — AB (ref 78.0–100.0)
Platelets: 400 10*3/uL (ref 150–400)
RBC: 4.24 MIL/uL (ref 3.87–5.11)
RDW: 16 % — ABNORMAL HIGH (ref 11.5–15.5)
WBC: 9.1 10*3/uL (ref 4.0–10.5)

## 2018-06-22 LAB — GASTROINTESTINAL PANEL BY PCR, STOOL (REPLACES STOOL CULTURE)

## 2018-06-22 LAB — PHOSPHORUS: Phosphorus: 1.5 mg/dL — ABNORMAL LOW (ref 2.5–4.6)

## 2018-06-22 LAB — BASIC METABOLIC PANEL
Anion gap: 14 (ref 5–15)
BUN: <5 mg/dL — ABNORMAL LOW (ref 6–20)
CALCIUM: 8 mg/dL — AB (ref 8.9–10.3)
CHLORIDE: 105 mmol/L (ref 98–111)
CO2: 20 mmol/L — AB (ref 22–32)
CREATININE: 0.71 mg/dL (ref 0.44–1.00)
GFR calc non Af Amer: >60 mL/min (ref >60)
Glucose, Bld: 86 mg/dL (ref 70–99)
Potassium: 3 mmol/L — ABNORMAL LOW (ref 3.5–5.1)
Sodium: 139 mmol/L (ref 135–145)

## 2018-06-22 LAB — C DIFFICILE QUICK SCREEN W PCR REFLEX
C Diff antigen: NEGATIVE
C Diff interpretation: NOT DETECTED
C Diff toxin: NEGATIVE

## 2018-06-22 LAB — MAGNESIUM
Magnesium: 1.8 mg/dL (ref 1.7–2.4)
Magnesium: 2.2 mg/dL (ref 1.7–2.4)

## 2018-06-22 LAB — TSH: TSH: 0.33 u[IU]/mL — ABNORMAL LOW (ref 0.350–4.500)

## 2018-06-22 LAB — T4, FREE: Free T4: 0.96 ng/dL (ref 0.82–1.77)

## 2018-06-22 LAB — APTT: aPTT: 40 seconds — ABNORMAL HIGH (ref 24–36)

## 2018-06-22 LAB — PROTIME-INR
INR: 1.25
Prothrombin Time: 15.6 seconds — ABNORMAL HIGH (ref 11.4–15.2)

## 2018-06-22 MED ORDER — IOPAMIDOL (ISOVUE-370) INJECTION 76%
100.0000 mL | Freq: Once | INTRAVENOUS | Status: AC | PRN
  Administered 2018-06-22: 100 mL via INTRAVENOUS

## 2018-06-22 MED ORDER — SODIUM CHLORIDE 0.9 % IV SOLN
INTRAVENOUS | Status: DC
  Administered 2018-06-22: 05:00:00 via INTRAVENOUS

## 2018-06-22 MED ORDER — POTASSIUM CHLORIDE 10 MEQ/100ML IV SOLN
10.0000 meq | Freq: Once | INTRAVENOUS | Status: AC
  Administered 2018-06-22: 10 meq via INTRAVENOUS
  Filled 2018-06-22: qty 100

## 2018-06-22 MED ORDER — METOCLOPRAMIDE HCL 5 MG/ML IJ SOLN
10.0000 mg | Freq: Once | INTRAMUSCULAR | Status: AC
  Administered 2018-06-22: 10 mg via INTRAVENOUS
  Filled 2018-06-22: qty 2

## 2018-06-22 MED ORDER — MAGNESIUM SULFATE 2 GM/50ML IV SOLN
2.0000 g | Freq: Once | INTRAVENOUS | Status: AC
  Administered 2018-06-22: 2 g via INTRAVENOUS
  Filled 2018-06-22: qty 50

## 2018-06-22 MED ORDER — IOPAMIDOL (ISOVUE-370) INJECTION 76%
INTRAVENOUS | Status: AC
  Filled 2018-06-22: qty 100

## 2018-06-22 MED ORDER — CYCLOBENZAPRINE HCL 5 MG PO TABS
5.0000 mg | ORAL_TABLET | Freq: Every day | ORAL | Status: DC
  Administered 2018-06-22 – 2018-06-24 (×2): 5 mg via ORAL
  Filled 2018-06-22 (×6): qty 1

## 2018-06-22 MED ORDER — POTASSIUM CHLORIDE IN NACL 20-0.9 MEQ/L-% IV SOLN
INTRAVENOUS | Status: DC
  Administered 2018-06-22 – 2018-06-23 (×2): via INTRAVENOUS
  Administered 2018-06-24: 1000 mL via INTRAVENOUS
  Administered 2018-06-24: via INTRAVENOUS
  Filled 2018-06-22 (×6): qty 1000

## 2018-06-22 MED ORDER — POTASSIUM CHLORIDE 20 MEQ/15ML (10%) PO SOLN
40.0000 meq | Freq: Once | ORAL | Status: AC
  Administered 2018-06-22: 40 meq via ORAL
  Filled 2018-06-22: qty 30

## 2018-06-22 MED ORDER — SODIUM CHLORIDE 0.9 % IV SOLN
INTRAVENOUS | Status: DC | PRN
  Administered 2018-06-25 – 2018-06-26 (×2): 100 mL via INTRAVENOUS
  Administered 2018-06-27: 250 mL via INTRAVENOUS

## 2018-06-22 MED ORDER — ONDANSETRON HCL 4 MG/2ML IJ SOLN
4.0000 mg | Freq: Three times a day (TID) | INTRAMUSCULAR | Status: DC | PRN
  Administered 2018-06-22 – 2018-06-25 (×3): 4 mg via INTRAVENOUS
  Filled 2018-06-22 (×3): qty 2

## 2018-06-22 MED ORDER — ZOLPIDEM TARTRATE 5 MG PO TABS
5.0000 mg | ORAL_TABLET | Freq: Every evening | ORAL | Status: DC | PRN
  Filled 2018-06-22 (×2): qty 1

## 2018-06-22 MED ORDER — SODIUM CHLORIDE 0.9 % IV BOLUS
1000.0000 mL | Freq: Once | INTRAVENOUS | Status: AC
  Administered 2018-06-22: 1000 mL via INTRAVENOUS

## 2018-06-22 MED ORDER — POTASSIUM CHLORIDE 10 MEQ/100ML IV SOLN
10.0000 meq | INTRAVENOUS | Status: AC
  Administered 2018-06-22 (×4): 10 meq via INTRAVENOUS
  Filled 2018-06-22 (×4): qty 100

## 2018-06-22 MED ORDER — FAMOTIDINE IN NACL 20-0.9 MG/50ML-% IV SOLN
20.0000 mg | Freq: Two times a day (BID) | INTRAVENOUS | Status: DC
  Administered 2018-06-22 – 2018-06-27 (×11): 20 mg via INTRAVENOUS
  Filled 2018-06-22 (×12): qty 50

## 2018-06-22 MED ORDER — SODIUM CHLORIDE 0.9 % IV BOLUS
500.0000 mL | Freq: Once | INTRAVENOUS | Status: AC
  Administered 2018-06-22: 500 mL via INTRAVENOUS

## 2018-06-22 MED ORDER — MAGNESIUM SULFATE 50 % IJ SOLN
0.5000 g | Freq: Once | INTRAVENOUS | Status: AC
  Administered 2018-06-22: 0.5 g via INTRAVENOUS
  Filled 2018-06-22: qty 1

## 2018-06-22 MED ORDER — ACETAMINOPHEN 325 MG PO TABS: 650.0000 mg | ORAL_TABLET | Freq: Four times a day (QID) | ORAL | Status: DC | PRN

## 2018-06-22 NOTE — H&P (Addendum)
History and Physical  LAKIMA TKAC FEO:[id number] DOB: 1971-06-23 DOA: 06/21/2018  Referring physician: ER provider PCP: Patient, No Pcp Per  Outpatient Specialists:    Patient coming from: Home  Chief Complaint: Nausea, vomiting and diarrhea for 3 weeks.  HPI:  Patient is a 47 year old African-American female, obese, with past medical history significant for arthritis, GERD and bloody stool.  Patient presents with a 3-week history of nausea, vomiting and diarrhea.  No associated fever or chills.  No sick contacts.  No history of recent use of antibiotics as steroids.  Patient's home medication included NSAIDs, but patient denies epigastric pain.  GI panel is negative.  Stool for C. difficile is pending.  INR is mildly elevated at 1.25.  Hemoglobin is 9.8 g/dL, but no leukocytosis.  UA reveals rare bacteria, and 10 WBC.  Potassium is 3 but CO2 is 20.  BUN is less than 5 with magnesium of 1.8.  Patient is noted to be significantly tachycardic, with heart rate of 120 to 140 bpm.  CT of the chest is negative for significant pulmonary embolism.  No headache, no neck pain, no URI symptoms, no shortness of breath, no chest pain, no abdominal pain, no urinary symptoms.  Lactic acid is within normal range.  No phosphorus level visualized.  ED Course: Patient has remained afebrile.  Blood pressure stable at 121/69 mmHg, tachycardia of 111 to 133 bpm documented, respiratory rate of 12 to 36/min and O2 sat of 98%.  CBC reveals WBC of 9.1, hemoglobin of 9.8, hematocrit of 32.7, MCV of 77.1 with platelet count of 400.  INR is 1.25.  APTT is 40.  Sodium is 139, potassium of 3, chloride 105, CO2 20, BUN of less than five-point serum creatinine of 0.71 with blood sugar of 86.  Magnesium is 1.8.  GI panel is negative, CTA of chest is negative for significant pulmonary embolism. Pertinent labs: See above. EKG: Independently reviewed.  Imaging: independently reviewed.   Review of Systems:  Negative for fever, visual  changes, sore throat, rash, new muscle aches, chest pain, SOB, dysuria, bleeding.  Past Medical History:  Diagnosis Date  . Arthritis    both legs sciatic nerve pain at times  . Blood in stool    since april 2015, found microscopic  . GERD (gastroesophageal reflux disease)     Past Surgical History:  Procedure Laterality Date  . CESAREAN SECTION     x 2  . COLONOSCOPY WITH PROPOFOL N/A 05/06/2014   Procedure: COLONOSCOPY WITH PROPOFOL;  Surgeon: Charolett Bumpers, MD;  Location: [facility];  Service: Endoscopy;  Laterality: N/A;  . ESOPHAGOGASTRODUODENOSCOPY (EGD) WITH PROPOFOL N/A 05/06/2014   Procedure: ESOPHAGOGASTRODUODENOSCOPY (EGD) WITH PROPOFOL;  Surgeon: Charolett Bumpers, MD;  Location: [facility];  Service: Endoscopy;  Laterality: N/A;     reports that she has never smoked. She has never used smokeless tobacco. She reports that she does not drink alcohol or use drugs.  Allergies  Allergen Reactions  . Penicillins Hives    No family history on file.   Prior to Admission medications   Medication Sig Start Date End Date Taking? Authorizing Provider  cyclobenzaprine (FLEXERIL) 5 MG tablet Take 1 tablet (5 mg total) by mouth at bedtime. 06/14/18  Yes Linus Mako B, NP  loperamide (IMODIUM) 2 MG capsule Take 1 capsule (2 mg total) by mouth 4 (four) times daily as needed for diarrhea or loose stools. 06/14/18  Yes Linus Mako B, NP  ondansetron (ZOFRAN) 4 MG tablet Take  1 tablet (4 mg total) by mouth every 8 (eight) hours as needed for nausea or vomiting. 06/14/18   Georgetta Haber, NP    Physical Exam: Vitals:   06/22/18 0500 06/22/18 0517 06/22/18 0541 06/22/18 0542  BP: 115/66   121/69  Pulse: (!) 124 (!) 118  (!) 116  Resp: (!) 21 (!) 29    Temp:    99.6 F (37.6 C)  TempSrc:      SpO2: 96% 96%  98%  Weight:   85.9 kg   Height:   4\' 10"  (1.473 m)    Constitutional:  . Appears calm and comfortable. Eyes:  Marland Kitchen Mild pallor. No jaundice.  Mild proptosis  noted  ENMT:  . external ears, nose appear normal.  Dry buccal mucosa Neck:  . Neck is supple. No JVD Respiratory:  . CTA bilaterally, no w/r/r.  . Respiratory effort normal. No retractions or accessory muscle use Cardiovascular:  . S1S2, tachycardia . No LE extremity edema   Abdomen:  . Abdomen is obese, soft and non tender. Organs are difficult to assess. Neurologic:  . Awake and alert. . Moves all limbs.  Wt Readings from Last 3 Encounters:  06/22/18 85.9 kg  03/24/15 89.5 kg  05/06/14 77.6 kg    I have personally reviewed following labs and imaging studies  Labs on Admission:  CBC: Recent Labs  Lab 06/21/18 1913 06/22/18 0538  WBC 10.4 9.1  HGB 10.4* 9.8*  HCT 34.4* 32.7*  MCV 75.9* 77.1*  PLT 437* 400   Basic Metabolic Panel: Recent Labs  Lab 06/21/18 1913 06/22/18 0538  NA 137 139  K 3.2* 3.0*  CL 96* 105  CO2 22 20*  GLUCOSE 81 86  BUN <5* <5*  CREATININE 0.87 0.71  CALCIUM 8.9 8.0*  MG  --  1.8   Liver Function Tests: Recent Labs  Lab 06/21/18 1913  AST 13*  ALT 12  ALKPHOS 114  BILITOT 1.2  PROT 7.6  ALBUMIN 2.4*   Recent Labs  Lab 06/21/18 1913  LIPASE 22   No results for input(s): AMMONIA in the last 168 hours. Coagulation Profile: Recent Labs  Lab 06/22/18 0538  INR 1.25   Cardiac Enzymes: No results for input(s): CKTOTAL, CKMB, CKMBINDEX, TROPONINI in the last 168 hours. BNP (last 3 results) No results for input(s): PROBNP in the last 8760 hours. HbA1C: No results for input(s): HGBA1C in the last 72 hours. CBG: No results for input(s): GLUCAP in the last 168 hours. Lipid Profile: No results for input(s): CHOL, HDL, LDLCALC, TRIG, CHOLHDL, LDLDIRECT in the last 72 hours. Thyroid Function Tests: No results for input(s): TSH, T4TOTAL, FREET4, T3FREE, THYROIDAB in the last 72 hours. Anemia Panel: No results for input(s): VITAMINB12, FOLATE, FERRITIN, TIBC, IRON, RETICCTPCT in the last 72 hours. Urine analysis:      Component Value Date/Time   COLORURINE AMBER (A) 06/21/2018 1909   APPEARANCEUR HAZY (A) 06/21/2018 1909   LABSPEC 1.025 06/21/2018 1909   PHURINE 5.0 06/21/2018 1909   GLUCOSEU NEGATIVE 06/21/2018 1909   HGBUR MODERATE (A) 06/21/2018 1909   BILIRUBINUR MODERATE (A) 06/21/2018 1909   KETONESUR 80 (A) 06/21/2018 1909   PROTEINUR 100 (A) 06/21/2018 1909   UROBILINOGEN 0.2 06/14/2018 2023   NITRITE NEGATIVE 06/21/2018 1909   LEUKOCYTESUR NEGATIVE 06/21/2018 1909   Sepsis Labs: @LABRCNTIP (procalcitonin:4,lacticidven:4) ) Recent Results (from the past 240 hour(s))  Gastrointestinal Panel by PCR , Stool     Status: None   Collection Time: 06/22/18  4:49 AM  Result Value Ref Range Status   Campylobacter species NOT DETECTED NOT DETECTED Final   Plesimonas shigelloides NOT DETECTED NOT DETECTED Final   Salmonella species NOT DETECTED NOT DETECTED Final   Yersinia enterocolitica NOT DETECTED NOT DETECTED Final   Vibrio species NOT DETECTED NOT DETECTED Final   Vibrio cholerae NOT DETECTED NOT DETECTED Final   Enteroaggregative E coli (EAEC) NOT DETECTED NOT DETECTED Final   Enteropathogenic E coli (EPEC) NOT DETECTED NOT DETECTED Final   Enterotoxigenic E coli (ETEC) NOT DETECTED NOT DETECTED Final   Shiga like toxin producing E coli (STEC) NOT DETECTED NOT DETECTED Final   Shigella/Enteroinvasive E coli (EIEC) NOT DETECTED NOT DETECTED Final   Cryptosporidium NOT DETECTED NOT DETECTED Final   Cyclospora cayetanensis NOT DETECTED NOT DETECTED Final   Entamoeba histolytica NOT DETECTED NOT DETECTED Final   Giardia lamblia NOT DETECTED NOT DETECTED Final   Adenovirus F40/41 NOT DETECTED NOT DETECTED Final   Astrovirus NOT DETECTED NOT DETECTED Final   Norovirus GI/GII NOT DETECTED NOT DETECTED Final   Rotavirus A NOT DETECTED NOT DETECTED Final   Sapovirus (I, II, IV, and V) NOT DETECTED NOT DETECTED Final    Comment: Performed at [facility], [address]  [place], [place] [address]      Radiological Exams on Admission: Ct Angio Chest Pe W And/or Wo Contrast  Result Date: 06/22/2018 CLINICAL DATA:  Tachycardia with right shoulder and chest pain. EXAM: CT ANGIOGRAPHY CHEST WITH CONTRAST TECHNIQUE: Multidetector CT imaging of the chest was performed using the standard protocol during bolus administration of intravenous contrast. Multiplanar CT image reconstructions and MIPs were obtained to evaluate the vascular anatomy. CONTRAST:  ISOVUE-370 IOPAMIDOL (ISOVUE-370) INJECTION 76% COMPARISON:  None. FINDINGS: Cardiovascular: Motion artifact limits examination. Good opacification of the central and segmental pulmonary arteries. No focal filling defects. No evidence of significant pulmonary embolus. Normal heart size. No pericardial effusion. Normal caliber thoracic aorta. No dissection. Great vessel origins are patent. Mild aortic calcification. Mediastinum/Nodes: Esophagus is decompressed. No significant lymphadenopathy in the chest. Thyroid gland is unremarkable. Lungs/Pleura: Motion artifact limits evaluation. No airspace disease or consolidation is appreciated. No pleural effusions. No pneumothorax. Airways are patent. Upper Abdomen: No acute abnormality. Musculoskeletal: No chest wall abnormality. No acute or significant osseous findings. Review of the MIP images confirms the above findings. IMPRESSION: No evidence of significant pulmonary embolus. No evidence of active pulmonary disease. Electronically Signed   By: Burman Nieves M.D.   On: 06/22/2018 01:27   US Abdomen Limited  Result Date: 06/21/2018 CLINICAL DATA:  Initial evaluation for acute abdominal pain with right-sided chest pain, nausea, vomiting. EXAM: ULTRASOUND ABDOMEN LIMITED RIGHT UPPER QUADRANT COMPARISON:  None. FINDINGS: Gallbladder: No gallstones or wall thickening visualized. No sonographic Murphy sign noted by sonographer. Common bile duct: Diameter: 3.4 mm Liver: No focal lesion  identified. Within normal limits in parenchymal echogenicity. Portal vein is patent on color Doppler imaging with normal direction of blood flow towards the liver. IMPRESSION: Normal right upper quadrant ultrasound. Electronically Signed   By: Rise Mu M.D.   On: 06/21/2018 23:41   Dg Abd Acute W/chest  Result Date: 06/21/2018 CLINICAL DATA:  Initial evaluation for acute abdominal pain, right chest pain, nausea, vomiting. EXAM: DG ABDOMEN ACUTE W/ 1V CHEST COMPARISON:  Prior radiograph from 12/11/2013. FINDINGS: Cardiac and mediastinal silhouettes within normal limits. Mild aortic atherosclerosis. Lungs normally inflated. Minimal bibasilar subsegmental atelectatic changes. No focal infiltrates. No pulmonary edema or pleural effusion.  No pneumothorax. Bowel gas pattern within normal limits without obstruction or ileus. No abnormal bowel wall thickening. No free air. No soft tissue mass or abnormal calcification. Visualized osseous structures within normal limits. IMPRESSION: 1. Nonobstructive bowel gas pattern with no radiographic evidence for acute intra-abdominal process. 2. Mild bibasilar subsegmental atelectasis. No other active cardiopulmonary disease. 3. Mild aortic atherosclerosis. Electronically Signed   By: Rise Mu M.D.   On: 06/21/2018 23:53    EKG: Independently reviewed.   Active Problems:   Nausea vomiting and diarrhea   Gastroenteritis   Assessment/Plan Gastroenteritis: Follow results of stool ova and parasites Follow the results of the stool C. Difficile Supportive care IV fluids Monitor and replete abnormal electrolytes Low threshold to consult GI if problem persists  Volume depletion: IV fluids.  Will use normal saline plus KCl Monitor volume status closely Monitor renal function and electrolytes  Hypokalemia: Potassium on presentation was 3 We will continue to replete and monitor Magnesium was 1.8 We will keep magnesium greater than 2.  Sinus  tachycardia: Aggressive volume resuscitation Check TSH and free T4. CT of the chest is negative for significant pulmonary embolism. EKG reviewed and noted. Continue to monitor and assess.  DVT prophylaxis: SCD Code Status: Full Family Communication: Husband Disposition Plan: Home eventually Consults called: None Admission status: Observation  Time spent: 65 minutes  Berton Mount, MD  [facility] Pager #: [phone] 7PM-7AM contact night coverage as above   06/22/2018, 11:51 AM

## 2018-06-22 NOTE — Progress Notes (Signed)
Initial Nutrition Assessment  DOCUMENTATION CODES:   Obesity unspecified  INTERVENTION:   - Once diet advanced, provide Ensure Enlive po BID, each supplement provides 350 kcal and 20 grams of protein (vanilla)  NUTRITION DIAGNOSIS:   Inadequate oral intake related to nausea, vomiting as evidenced by per patient/family report.  GOAL:   Patient will meet greater than or equal to 90% of their needs  MONITOR:   Diet advancement, Labs, Weight trends, I & O's  REASON FOR ASSESSMENT:   Malnutrition Screening Tool    ASSESSMENT:   47 year old female who presented to the ED with nausea, vomiting, and diarrhea for 3 weeks. PMH significant for GERD, sciatica, and anemia.  Noted pt is C. Diff negative per results this morning. GI panel is negative.  Spoke with pt at bedside. Pt resting in recliner at time of visit. Pt shares that her nausea has improved since her last episode of emesis a few hours prior to RD visit.  Pt states that she has not been eating well over the past 3 weeks due to persistent N/V and diarrhea. Pt reports that in a typical day she would either eat "nothing" or 2-3 saltine crackers and a few bites of banana.  Pt reports that prior to the past 3 weeks, she ate at least 3 meals daily and maintained her weight between 190-196 lbs. Pt endorses weight loss during the past 3 weeks and states that her current weight is 182 lbs. Weight history in chart is limited as last charted weight PTA was in 2016.  Pt is agreeable to receiving Ensure Enlive BID once diet advanced. Pt reports feeling very thirsty.  Medications reviewed and include: 20 mg IV Pepcid BID, 0.5 grams magnesium sulfate once, 10 mEq KCl x 4 runs today  Labs reviewed: potassium 3.7 after being replaced, CO2 20 (L), hemoglobin 9.8 (L), HCT 32.7 (L), phosphorus 1.5 (L)  NUTRITION - FOCUSED PHYSICAL EXAM:    Most Recent Value  Orbital Region  No depletion  Upper Arm Region  No depletion  Thoracic and  Lumbar Region  No depletion  Buccal Region  No depletion  Temple Region  No depletion  Clavicle Bone Region  No depletion  Clavicle and Acromion Bone Region  Mild depletion  Scapular Bone Region  No depletion  Dorsal Hand  No depletion  Patellar Region  Mild depletion  Anterior Thigh Region  Mild depletion  Posterior Calf Region  No depletion  Edema (RD Assessment)  None  Hair  Reviewed  Eyes  Reviewed  Mouth  Reviewed  Skin  Reviewed  Nails  Reviewed       Diet Order:   Diet Order            Diet NPO time specified Except for: Sips with Meds  Diet effective now              EDUCATION NEEDS:   No education needs have been identified at this time  Skin:  Skin Assessment: Reviewed RN Assessment  Last BM:  06/22/18 large type 7  Height:   Ht Readings from Last 1 Encounters:  06/22/18 4\' 10"  (1.473 m)    Weight:   Wt Readings from Last 1 Encounters:  06/22/18 85.9 kg    Ideal Body Weight:  40.91 kg  BMI:  Body mass index is 39.58 kg/m.  Estimated Nutritional Needs:   Kcal:  1600-1800  Protein:  85-100 grams  Fluid:  1.6-1.8 L    Earma Reading, MS, RD,  LDN Pager: [phone] Weekend/After Hours: [phone]

## 2018-06-22 NOTE — ED Notes (Signed)
Patient transported to CT 

## 2018-06-22 NOTE — ED Notes (Signed)
Pt ambulatory to and from hallway bathroom without difficulty, standby assist only.

## 2018-06-22 NOTE — Progress Notes (Signed)
Katrina Oconnor was admitted to 5w31 from the ED via stretcher.  The patient ambulated to the bed from the stretcher without incident.  The patient is alert and oriented x4.  Admission booklet given.  Bed is in the lowest position, call bell and telephone are within reach.  Explained to patient how to use call bell and telephone, patient indicated understanding.  Patient denies any further needs at this time.  Will continue to monitor.

## 2018-06-22 NOTE — Care Management (Signed)
This is a no charge note  Pending admission per [place], [place]  47 year old lady with a past medical history of GERD, arthritis, who presents with 3 weeks of nausea, vomiting, watery diarrhea.  Patient has some mild abdominal pain.  Pretty physician, patient reported blood in stool on wiping.  Patient was found to have sinus tachycardia, tachypnea, oxygen saturation 91 to 94% on room air, temperature 99.3.  WBC 10.4, lactic acid 0.89, negative troponin, negative UDS, lipase 22, negative pregnancy test, potassium 3.2, creatinine normal, CT angiogram negative for PE.  X-ray of acute abdomen/chest negative.  Ultrasound of abdomen is negative.  Urinalysis with hazy appearance, rare bacteria and WBC-10. Pt was given 3L NS and still tachycardic, with heart rate 120-130s. Will give 500 cc normal saline bolus, continue NS at 125 cc/h. Pt is placed in telemetry bed of observation.   Lorretta Harp, MD  [facility] Pager [phone]  If 7PM-7AM, please contact night-coverage [email or web address] Password [email or web address] 06/22/2018, 4:10 AM

## 2018-06-23 DIAGNOSIS — Z6839 Body mass index (BMI) 39.0-39.9, adult: Secondary | ICD-10-CM | POA: Diagnosis not present

## 2018-06-23 DIAGNOSIS — R197 Diarrhea, unspecified: Secondary | ICD-10-CM | POA: Diagnosis present

## 2018-06-23 DIAGNOSIS — E669 Obesity, unspecified: Secondary | ICD-10-CM | POA: Diagnosis present

## 2018-06-23 DIAGNOSIS — Z880 Allergy status to penicillin: Secondary | ICD-10-CM | POA: Diagnosis not present

## 2018-06-23 DIAGNOSIS — K921 Melena: Secondary | ICD-10-CM | POA: Diagnosis present

## 2018-06-23 DIAGNOSIS — Z791 Long term (current) use of non-steroidal anti-inflammatories (NSAID): Secondary | ICD-10-CM | POA: Diagnosis not present

## 2018-06-23 DIAGNOSIS — R112 Nausea with vomiting, unspecified: Secondary | ICD-10-CM

## 2018-06-23 DIAGNOSIS — Z79899 Other long term (current) drug therapy: Secondary | ICD-10-CM | POA: Diagnosis not present

## 2018-06-23 DIAGNOSIS — R9431 Abnormal electrocardiogram [ECG] [EKG]: Secondary | ICD-10-CM | POA: Diagnosis present

## 2018-06-23 DIAGNOSIS — E876 Hypokalemia: Secondary | ICD-10-CM | POA: Diagnosis not present

## 2018-06-23 DIAGNOSIS — D62 Acute posthemorrhagic anemia: Secondary | ICD-10-CM | POA: Diagnosis not present

## 2018-06-23 DIAGNOSIS — K219 Gastro-esophageal reflux disease without esophagitis: Secondary | ICD-10-CM | POA: Diagnosis present

## 2018-06-23 DIAGNOSIS — R634 Abnormal weight loss: Secondary | ICD-10-CM | POA: Diagnosis present

## 2018-06-23 DIAGNOSIS — I5030 Unspecified diastolic (congestive) heart failure: Secondary | ICD-10-CM | POA: Diagnosis not present

## 2018-06-23 DIAGNOSIS — E869 Volume depletion, unspecified: Secondary | ICD-10-CM | POA: Diagnosis present

## 2018-06-23 DIAGNOSIS — M25511 Pain in right shoulder: Secondary | ICD-10-CM | POA: Diagnosis present

## 2018-06-23 DIAGNOSIS — D500 Iron deficiency anemia secondary to blood loss (chronic): Secondary | ICD-10-CM | POA: Diagnosis present

## 2018-06-23 DIAGNOSIS — K529 Noninfective gastroenteritis and colitis, unspecified: Secondary | ICD-10-CM | POA: Diagnosis not present

## 2018-06-23 DIAGNOSIS — Z98891 History of uterine scar from previous surgery: Secondary | ICD-10-CM | POA: Diagnosis not present

## 2018-06-23 DIAGNOSIS — M5440 Lumbago with sciatica, unspecified side: Secondary | ICD-10-CM | POA: Diagnosis present

## 2018-06-23 DIAGNOSIS — K5190 Ulcerative colitis, unspecified, without complications: Secondary | ICD-10-CM | POA: Diagnosis present

## 2018-06-23 DIAGNOSIS — D509 Iron deficiency anemia, unspecified: Secondary | ICD-10-CM | POA: Diagnosis present

## 2018-06-23 DIAGNOSIS — M1990 Unspecified osteoarthritis, unspecified site: Secondary | ICD-10-CM | POA: Diagnosis present

## 2018-06-23 DIAGNOSIS — R000 Tachycardia, unspecified: Secondary | ICD-10-CM | POA: Diagnosis not present

## 2018-06-23 LAB — OCCULT BLOOD X 1 CARD TO LAB, STOOL: Fecal Occult Bld: POSITIVE — AB

## 2018-06-23 LAB — HIV ANTIBODY (ROUTINE TESTING W REFLEX): HIV Screen 4th Generation wRfx: NONREACTIVE

## 2018-06-23 MED ORDER — METOCLOPRAMIDE HCL 5 MG/ML IJ SOLN
10.0000 mg | Freq: Three times a day (TID) | INTRAMUSCULAR | Status: DC
  Administered 2018-06-23 – 2018-06-27 (×11): 10 mg via INTRAVENOUS
  Filled 2018-06-23 (×12): qty 2

## 2018-06-23 MED ORDER — K PHOS MONO-SOD PHOS DI & MONO 155-852-130 MG PO TABS
500.0000 mg | ORAL_TABLET | Freq: Three times a day (TID) | ORAL | Status: DC
  Administered 2018-06-23 – 2018-06-28 (×14): 500 mg via ORAL
  Filled 2018-06-23 (×16): qty 2

## 2018-06-23 MED ORDER — METOCLOPRAMIDE HCL 5 MG/ML IJ SOLN: 10.0000 mg | Freq: Four times a day (QID) | INTRAMUSCULAR | Status: DC

## 2018-06-23 MED ORDER — PROMETHAZINE HCL 25 MG/ML IJ SOLN
12.5000 mg | Freq: Four times a day (QID) | INTRAMUSCULAR | Status: DC | PRN
  Administered 2018-06-23 – 2018-06-25 (×2): 12.5 mg via INTRAVENOUS
  Filled 2018-06-23 (×2): qty 1

## 2018-06-23 NOTE — Progress Notes (Addendum)
PROGRESS NOTE    Katrina Oconnor  JXB:[id number] DOB: [birth year] DOA: 06/21/2018 PCP: Patient, No Pcp Per   Brief Narrative: Patient is a 47 year old female with past medical history of arthritis, GERD who presents with 2 weeks history of nausea, vomiting and diarrhea.  Hospital course was also remarkable for persistent sinus tachycardia.  She complains of nausea this afternoon.  Assessment & Plan:   Active Problems:   Nausea vomiting and diarrhea   Gastroenteritis  Presumed gastroenteritis: Presented with nausea, vomiting and diarrhea.  C. Difficile, GI pathogen, came out to be negative.  Continue IV fluids.  We will start her on clear liquid diet.  Nausea/vomiting: Continue Reglan, as needed promethazine If nausea and vomiting does not improve , will consider GI consultation.  Diarrhea: Diarrhea has slowed down.  Patient complained of dark/rusty stools.  Will check FOBT.  Hemoglobin is currently stable.  Sinus tachycardia: Most likely secondary to volume depletion secondary to vomiting, diarrhea.  We will continue IV fluids. TSH is slightly below normal but T4 is normal.  Hypokalemia: Resolved  Hypophosphatemia: Continue supplementation.  DVT prophylaxis: SCD Code Status: Full Family Communication: Husband present at the bedside Disposition Plan: Likely home after resolution of nausea, vomiting   Consultants: None Procedures: None  Antimicrobials:None  Subjective:  Patient seen and examined at bedside this afternoon.  Still complains of persistent nausea, vomiting.  Still found to be mildly tachycardic.  Complains of diffuse abdominal pain.  Objective: Vitals:   06/22/18 0541 06/22/18 0542 06/22/18 2200 06/23/18 0451  BP:  121/69 130/76 122/78  Pulse:  (!) 116 (!) 117 (!) 107  Resp:      Temp:  99.6 F (37.6 C) 100.1 F (37.8 C) 98.2 F (36.8 C)  TempSrc:      SpO2:  98% 99% 99%  Weight: 85.9 kg     Height: 4\' 10"  (1.473 m)       Intake/Output Summary (Last 24  hours) at 06/23/2018 1315 Last data filed at 06/23/2018 0308 Gross per 24 hour  Intake 50 ml  Output -  Net 50 ml   Filed Weights   06/21/18 1904 06/22/18 0541  Weight: 83.9 kg 85.9 kg    Examination:  General exam: Appears calm and comfortable , generalized weakness, obese HEENT:PERRL,Oral mucosa moist, Ear/Nose normal on gross exam Respiratory system: Bilateral equal air entry, normal vesicular breath sounds, no wheezes or crackles  Cardiovascular system: S1 & S2 heard, RRR. No JVD, murmurs, rubs, gallops or clicks. No pedal edema. Gastrointestinal system: Abdomen is nondistended, soft . No organomegaly or masses felt. Normal bowel sounds heard.  Mild generalized tenderness Central nervous system: Alert and oriented. No focal neurological deficits. Extremities: No edema, no clubbing ,no cyanosis, distal peripheral pulses palpable. Skin: No rashes, lesions or ulcers,no icterus ,no pallor MSK: Normal muscle bulk,tone ,power Psychiatry: Judgement and insight appear normal. Mood & affect appropriate.     Data Reviewed: I have personally reviewed following labs and imaging studies  CBC: Recent Labs  Lab 06/21/18 1913 06/22/18 0538  WBC 10.4 9.1  HGB 10.4* 9.8*  HCT 34.4* 32.7*  MCV 75.9* 77.1*  PLT 437* 400   Basic Metabolic Panel: Recent Labs  Lab 06/21/18 1913 06/22/18 0538 06/22/18 1327 06/22/18 1733  NA 137 139 140 138  K 3.2* 3.0* 3.7 4.1  CL 96* 105 109 108  CO2 22 20* 20* 20*  GLUCOSE 81 86 96 88  BUN <5* <5* <5* <5*  CREATININE 0.87 0.71 0.66 0.62  CALCIUM 8.9 8.0* 8.1* 7.8*  MG  --  1.8 2.2  --   PHOS  --   --  1.5* 1.7*  1.5*   GFR: Estimated Creatinine Clearance: 80 mL/min (by C-G formula based on SCr of 0.62 mg/dL). Liver Function Tests: Recent Labs  Lab 06/21/18 1913 06/22/18 1327 06/22/18 1733  AST 13*  --   --   ALT 12  --   --   ALKPHOS 114  --   --   BILITOT 1.2  --   --   PROT 7.6  --   --   ALBUMIN 2.4* 2.0* 1.9*   Recent Labs    Lab 06/21/18 1913  LIPASE 22   No results for input(s): AMMONIA in the last 168 hours. Coagulation Profile: Recent Labs  Lab 06/22/18 0538  INR 1.25   Cardiac Enzymes: No results for input(s): CKTOTAL, CKMB, CKMBINDEX, TROPONINI in the last 168 hours. BNP (last 3 results) No results for input(s): PROBNP in the last 8760 hours. HbA1C: No results for input(s): HGBA1C in the last 72 hours. CBG: No results for input(s): GLUCAP in the last 168 hours. Lipid Profile: No results for input(s): CHOL, HDL, LDLCALC, TRIG, CHOLHDL, LDLDIRECT in the last 72 hours. Thyroid Function Tests: Recent Labs    06/22/18 1327 06/22/18 1733  TSH  --  0.330*  FREET4 0.96  --    Anemia Panel: No results for input(s): VITAMINB12, FOLATE, FERRITIN, TIBC, IRON, RETICCTPCT in the last 72 hours. Sepsis Labs: Recent Labs  Lab 06/21/18 2356  LATICACIDVEN 0.89    Recent Results (from the past 240 hour(s))  C difficile quick scan w PCR reflex     Status: None   Collection Time: 06/22/18  4:48 AM  Result Value Ref Range Status   C Diff antigen NEGATIVE NEGATIVE Final   C Diff toxin NEGATIVE NEGATIVE Final   C Diff interpretation No C. difficile detected.  Final    Comment: Performed at [facility], [address], [place], [place] [address]  Gastrointestinal Panel by PCR , Stool     Status: None   Collection Time: 06/22/18  4:49 AM  Result Value Ref Range Status   Campylobacter species NOT DETECTED NOT DETECTED Final   Plesimonas shigelloides NOT DETECTED NOT DETECTED Final   Salmonella species NOT DETECTED NOT DETECTED Final   Yersinia enterocolitica NOT DETECTED NOT DETECTED Final   Vibrio species NOT DETECTED NOT DETECTED Final   Vibrio cholerae NOT DETECTED NOT DETECTED Final   Enteroaggregative E coli (EAEC) NOT DETECTED NOT DETECTED Final   Enteropathogenic E coli (EPEC) NOT DETECTED NOT DETECTED Final   Enterotoxigenic E coli (ETEC) NOT DETECTED NOT DETECTED Final   Shiga like  toxin producing E coli (STEC) NOT DETECTED NOT DETECTED Final   Shigella/Enteroinvasive E coli (EIEC) NOT DETECTED NOT DETECTED Final   Cryptosporidium NOT DETECTED NOT DETECTED Final   Cyclospora cayetanensis NOT DETECTED NOT DETECTED Final   Entamoeba histolytica NOT DETECTED NOT DETECTED Final   Giardia lamblia NOT DETECTED NOT DETECTED Final   Adenovirus F40/41 NOT DETECTED NOT DETECTED Final   Astrovirus NOT DETECTED NOT DETECTED Final   Norovirus GI/GII NOT DETECTED NOT DETECTED Final   Rotavirus A NOT DETECTED NOT DETECTED Final   Sapovirus (I, II, IV, and V) NOT DETECTED NOT DETECTED Final    Comment: Performed at [facility], [address] [place], [place] [address]         Radiology Studies: Ct Angio  Chest Pe W And/or Wo Contrast  Result Date: 06/22/2018 CLINICAL DATA:  Tachycardia with right shoulder and chest pain. EXAM: CT ANGIOGRAPHY CHEST WITH CONTRAST TECHNIQUE: Multidetector CT imaging of the chest was performed using the standard protocol during bolus administration of intravenous contrast. Multiplanar CT image reconstructions and MIPs were obtained to evaluate the vascular anatomy. CONTRAST:  ISOVUE-370 IOPAMIDOL (ISOVUE-370) INJECTION 76% COMPARISON:  None. FINDINGS: Cardiovascular: Motion artifact limits examination. Good opacification of the central and segmental pulmonary arteries. No focal filling defects. No evidence of significant pulmonary embolus. Normal heart size. No pericardial effusion. Normal caliber thoracic aorta. No dissection. Great vessel origins are patent. Mild aortic calcification. Mediastinum/Nodes: Esophagus is decompressed. No significant lymphadenopathy in the chest. Thyroid gland is unremarkable. Lungs/Pleura: Motion artifact limits evaluation. No airspace disease or consolidation is appreciated. No pleural effusions. No pneumothorax. Airways are patent. Upper Abdomen: No acute abnormality. Musculoskeletal: No chest wall  abnormality. No acute or significant osseous findings. Review of the MIP images confirms the above findings. IMPRESSION: No evidence of significant pulmonary embolus. No evidence of active pulmonary disease. Electronically Signed   By: Burman Nieves M.D.   On: 06/22/2018 01:27   US Abdomen Limited  Result Date: 06/21/2018 CLINICAL DATA:  Initial evaluation for acute abdominal pain with right-sided chest pain, nausea, vomiting. EXAM: ULTRASOUND ABDOMEN LIMITED RIGHT UPPER QUADRANT COMPARISON:  None. FINDINGS: Gallbladder: No gallstones or wall thickening visualized. No sonographic Murphy sign noted by sonographer. Common bile duct: Diameter: 3.4 mm Liver: No focal lesion identified. Within normal limits in parenchymal echogenicity. Portal vein is patent on color Doppler imaging with normal direction of blood flow towards the liver. IMPRESSION: Normal right upper quadrant ultrasound. Electronically Signed   By: Rise Mu M.D.   On: 06/21/2018 23:41   Dg Abd Acute W/chest  Result Date: 06/21/2018 CLINICAL DATA:  Initial evaluation for acute abdominal pain, right chest pain, nausea, vomiting. EXAM: DG ABDOMEN ACUTE W/ 1V CHEST COMPARISON:  Prior radiograph from 12/11/2013. FINDINGS: Cardiac and mediastinal silhouettes within normal limits. Mild aortic atherosclerosis. Lungs normally inflated. Minimal bibasilar subsegmental atelectatic changes. No focal infiltrates. No pulmonary edema or pleural effusion. No pneumothorax. Bowel gas pattern within normal limits without obstruction or ileus. No abnormal bowel wall thickening. No free air. No soft tissue mass or abnormal calcification. Visualized osseous structures within normal limits. IMPRESSION: 1. Nonobstructive bowel gas pattern with no radiographic evidence for acute intra-abdominal process. 2. Mild bibasilar subsegmental atelectasis. No other active cardiopulmonary disease. 3. Mild aortic atherosclerosis. Electronically Signed   By: Rise Mu M.D.   On: 06/21/2018 23:53        Scheduled Meds: . cyclobenzaprine  5 mg Oral QHS  . metoCLOPramide (REGLAN) injection  10 mg Intravenous Q8H  . phosphorus  500 mg Oral TID   Continuous Infusions: . sodium chloride 10 mL/hr at 06/22/18 0615  . 0.9 % NaCl with KCl 20 mEq / L 100 mL/hr at 06/23/18 0442  . famotidine (PEPCID) IV Stopped (06/23/18 1006)     LOS: 0 days    Time spent: 25 mins.More than 50% of that time was spent in counseling and/or coordination of care.      Burnadette Pop, MD [facility] Pager [phone]  If 7PM-7AM, please contact night-coverage [email or web address] Password [email or web address] 06/23/2018, 1:15 PM

## 2018-06-23 NOTE — Progress Notes (Signed)
Notified by lab that patient refused labs to be drawn twice today. Dr Renford Dills text paged to notify.

## 2018-06-24 LAB — CBC WITH DIFFERENTIAL/PLATELET
BAND NEUTROPHILS: 0 %
BASOS ABS: 0 10*3/uL (ref 0.0–0.1)
BLASTS: 0 %
Basophils Relative: 0 %
EOS ABS: 0 10*3/uL (ref 0.0–0.7)
Eosinophils Relative: 0 %
HEMATOCRIT: 31.1 % — AB (ref 36.0–46.0)
Hemoglobin: 9.2 g/dL — ABNORMAL LOW (ref 12.0–15.0)
Lymphocytes Relative: 25 %
Lymphs Abs: 2 10*3/uL (ref 0.7–4.0)
MCH: 22.8 pg — ABNORMAL LOW (ref 26.0–34.0)
MCHC: 29.6 g/dL — ABNORMAL LOW (ref 30.0–36.0)
MCV: 77 fL — AB (ref 78.0–100.0)
METAMYELOCYTES PCT: 0 %
MONOS PCT: 8 %
Monocytes Absolute: 0.6 10*3/uL (ref 0.1–1.0)
Myelocytes: 0 %
Neutro Abs: 5.2 10*3/uL (ref 1.7–7.7)
Neutrophils Relative %: 67 %
Other: 0 %
Platelets: 290 10*3/uL (ref 150–400)
Promyelocytes Relative: 0 %
RBC: 4.04 MIL/uL (ref 3.87–5.11)
RDW: 16.6 % — ABNORMAL HIGH (ref 11.5–15.5)
WBC MORPHOLOGY: INCREASED
WBC: 7.8 10*3/uL (ref 4.0–10.5)
nRBC: 0 /100 WBC

## 2018-06-24 LAB — BASIC METABOLIC PANEL
ANION GAP: 7 (ref 5–15)
BUN: <5 mg/dL — ABNORMAL LOW (ref 6–20)
CALCIUM: 8 mg/dL — AB (ref 8.9–10.3)
CHLORIDE: 106 mmol/L (ref 98–111)
CO2: 25 mmol/L (ref 22–32)
Creatinine, Ser: 0.54 mg/dL (ref 0.44–1.00)
GFR calc non Af Amer: >60 mL/min (ref >60)
GLUCOSE: 123 mg/dL — AB (ref 70–99)
POTASSIUM: 5.1 mmol/L (ref 3.5–5.1)
Sodium: 138 mmol/L (ref 135–145)

## 2018-06-24 LAB — HEMOGLOBIN A1C
HEMOGLOBIN A1C: 5.7 % — AB (ref 4.8–5.6)
MEAN PLASMA GLUCOSE: 116.89 mg/dL

## 2018-06-24 LAB — TROPONIN I

## 2018-06-24 LAB — PHOSPHORUS: Phosphorus: 1.9 mg/dL — ABNORMAL LOW (ref 2.5–4.6)

## 2018-06-24 MED ORDER — ACETAMINOPHEN 325 MG PO TABS
650.0000 mg | ORAL_TABLET | Freq: Four times a day (QID) | ORAL | Status: DC | PRN
  Administered 2018-06-24 – 2018-06-26 (×3): 650 mg via ORAL
  Filled 2018-06-24 (×4): qty 2

## 2018-06-24 MED ORDER — SODIUM CHLORIDE 0.9 % IV BOLUS
1000.0000 mL | Freq: Once | INTRAVENOUS | Status: AC
  Administered 2018-06-25: 1000 mL via INTRAVENOUS

## 2018-06-24 MED ORDER — SODIUM CHLORIDE 0.9 % IV SOLN
INTRAVENOUS | Status: DC
  Administered 2018-06-24 – 2018-06-25 (×2): via INTRAVENOUS

## 2018-06-24 NOTE — Progress Notes (Signed)
Patient refused lab drawn this morning x 2. Stated she prefers a Film/video editor  Insurance risk surveyor) to draw her lab and the person is not working today. No other issues noted. Will continue to monitor.

## 2018-06-24 NOTE — Progress Notes (Signed)
PROGRESS NOTE    XYLA KNITTER  ERD:[id number] DOB: 07/02/1971 DOA: 06/21/2018 PCP: Patient, No Pcp Per   Brief Narrative: Patient is a 47 year old female with past medical history of arthritis, GERD who presents with 2 weeks history of nausea, vomiting and diarrhea.  Hospital course was also remarkable for persistent sinus tachycardia.  GI started today for positive FOBT.  Assessment & Plan:   Active Problems:   Nausea vomiting and diarrhea   Gastroenteritis  Presumed gastroenteritis: Presented with nausea, vomiting and diarrhea.  C. Difficile, GI pathogen, came out to be negative.  Continue IV fluids.  We  started her on clear liquid diet.  Nausea/vomiting: Continue Reglan, as needed promethazine Nausea and vomiting have slightly improved this morning.  Diarrhea: Diarrhea has slowed down.  Patient complained of dark/rusty stools.  Positive  FOBT.  Hemoglobin is  Stable.Will check again today. GI  Consulted  Sinus tachycardia: Thought secondary to volume depletion secondary to vomiting, diarrhea.  We will continue IV fluids. TSH is slightly below normal but T4 is normal.  Will check echocardiogram, repeat EKG today  Hypokalemia: Resolved  Hypophosphatemia: Continue supplementation.  Check phosphorus level today.  DVT prophylaxis: SCD Code Status: Full Family Communication: None present at the bedside Disposition Plan: Likely home after resolution of nausea, vomiting,GI workup   Consultants: None Procedures: None  Antimicrobials:None  Subjective:  Patient seen and examined at bedside this afternoon.  Nausea, vomiting have improved.  Diarrhea has stopped.  Continues to be in sinus tachycardia. Objective: Vitals:   06/23/18 1340 06/23/18 1340 06/23/18 2213 06/24/18 0512  BP: 130/77 130/77 124/73 115/73  Pulse: (!) 114 (!) 114 (!) 113 (!) 108  Resp: 16 16 18 18   Temp: 99.3 F (37.4 C) 99.3 F (37.4 C) (!) 100.7 F (38.2 C) 99.2 F (37.3 C)  TempSrc: Oral     SpO2: 99%  99% 100% 100%  Weight:      Height:        Intake/Output Summary (Last 24 hours) at 06/24/2018 1357 Last data filed at 06/24/2018 0800 Gross per 24 hour  Intake 2558.89 ml  Output -  Net 2558.89 ml   Filed Weights   06/21/18 1904 06/22/18 0541  Weight: 83.9 kg 85.9 kg    Examination:  General exam: Generalized weakness, obese  HEENT:PERRL,Oral mucosa moist, Ear/Nose normal on gross exam Respiratory system: Bilateral equal air entry, normal vesicular breath sounds, no wheezes or crackles  Cardiovascular system: Sinus tachycardia no JVD, murmurs, rubs, gallops or clicks. Gastrointestinal system: Abdomen is nondistended, soft and nontender. No organomegaly or masses felt. Normal bowel sounds heard. Central nervous system: Alert and oriented. No focal neurological deficits. Extremities: No edema, no clubbing ,no cyanosis, distal peripheral pulses palpable. Skin: No rashes, lesions or ulcers,no icterus ,no pallor MSK: Normal muscle bulk,tone ,power Psychiatry: Judgement and insight appear normal. Mood & affect appropriate.     Data Reviewed: I have personally reviewed following labs and imaging studies  CBC: Recent Labs  Lab 06/21/18 1913 06/22/18 0538  WBC 10.4 9.1  HGB 10.4* 9.8*  HCT 34.4* 32.7*  MCV 75.9* 77.1*  PLT 437* 400   Basic Metabolic Panel: Recent Labs  Lab 06/21/18 1913 06/22/18 0538 06/22/18 1327 06/22/18 1733  NA 137 139 140 138  K 3.2* 3.0* 3.7 4.1  CL 96* 105 109 108  CO2 22 20* 20* 20*  GLUCOSE 81 86 96 88  BUN <5* <5* <5* <5*  CREATININE 0.87 0.71 0.66 0.62  CALCIUM 8.9 8.0*  8.1* 7.8*  MG  --  1.8 2.2  --   PHOS  --   --  1.5* 1.7*  1.5*   GFR: Estimated Creatinine Clearance: 80 mL/min (by C-G formula based on SCr of 0.62 mg/dL). Liver Function Tests: Recent Labs  Lab 06/21/18 1913 06/22/18 1327 06/22/18 1733  AST 13*  --   --   ALT 12  --   --   ALKPHOS 114  --   --   BILITOT 1.2  --   --   PROT 7.6  --   --   ALBUMIN 2.4* 2.0*  1.9*   Recent Labs  Lab 06/21/18 1913  LIPASE 22   No results for input(s): AMMONIA in the last 168 hours. Coagulation Profile: Recent Labs  Lab 06/22/18 0538  INR 1.25   Cardiac Enzymes: No results for input(s): CKTOTAL, CKMB, CKMBINDEX, TROPONINI in the last 168 hours. BNP (last 3 results) No results for input(s): PROBNP in the last 8760 hours. HbA1C: No results for input(s): HGBA1C in the last 72 hours. CBG: No results for input(s): GLUCAP in the last 168 hours. Lipid Profile: No results for input(s): CHOL, HDL, LDLCALC, TRIG, CHOLHDL, LDLDIRECT in the last 72 hours. Thyroid Function Tests: Recent Labs    06/22/18 1327 06/22/18 1733  TSH  --  0.330*  FREET4 0.96  --    Anemia Panel: No results for input(s): VITAMINB12, FOLATE, FERRITIN, TIBC, IRON, RETICCTPCT in the last 72 hours. Sepsis Labs: Recent Labs  Lab 06/21/18 2356  LATICACIDVEN 0.89    Recent Results (from the past 240 hour(s))  C difficile quick scan w PCR reflex     Status: None   Collection Time: 06/22/18  4:48 AM  Result Value Ref Range Status   C Diff antigen NEGATIVE NEGATIVE Final   C Diff toxin NEGATIVE NEGATIVE Final   C Diff interpretation No C. difficile detected.  Final    Comment: Performed at [facility], [address] [place], [place] [address]  Gastrointestinal Panel by PCR , Stool     Status: None   Collection Time: 06/22/18  4:49 AM  Result Value Ref Range Status   Campylobacter species NOT DETECTED NOT DETECTED Final   Plesimonas shigelloides NOT DETECTED NOT DETECTED Final   Salmonella species NOT DETECTED NOT DETECTED Final   Yersinia enterocolitica NOT DETECTED NOT DETECTED Final   Vibrio species NOT DETECTED NOT DETECTED Final   Vibrio cholerae NOT DETECTED NOT DETECTED Final   Enteroaggregative E coli (EAEC) NOT DETECTED NOT DETECTED Final   Enteropathogenic E coli (EPEC) NOT DETECTED NOT DETECTED Final   Enterotoxigenic E coli (ETEC) NOT DETECTED NOT DETECTED  Final   Shiga like toxin producing E coli (STEC) NOT DETECTED NOT DETECTED Final   Shigella/Enteroinvasive E coli (EIEC) NOT DETECTED NOT DETECTED Final   Cryptosporidium NOT DETECTED NOT DETECTED Final   Cyclospora cayetanensis NOT DETECTED NOT DETECTED Final   Entamoeba histolytica NOT DETECTED NOT DETECTED Final   Giardia lamblia NOT DETECTED NOT DETECTED Final   Adenovirus F40/41 NOT DETECTED NOT DETECTED Final   Astrovirus NOT DETECTED NOT DETECTED Final   Norovirus GI/GII NOT DETECTED NOT DETECTED Final   Rotavirus A NOT DETECTED NOT DETECTED Final   Sapovirus (I, II, IV, and V) NOT DETECTED NOT DETECTED Final    Comment: Performed at [facility], [address] [place], [place] [address]         Radiology Studies: No results found.  Scheduled Meds: . cyclobenzaprine  5 mg Oral QHS  . metoCLOPramide (REGLAN) injection  10 mg Intravenous Q8H  . phosphorus  500 mg Oral TID   Continuous Infusions: . sodium chloride 10 mL/hr at 06/22/18 0615  . 0.9 % NaCl with KCl 20 mEq / L 1,000 mL (06/24/18 0800)  . famotidine (PEPCID) IV 20 mg (06/24/18 0939)     LOS: 1 day    Time spent: 25 mins.More than 50% of that time was spent in counseling and/or coordination of care.      Burnadette Pop, MD [facility] Pager [phone]  If 7PM-7AM, please contact night-coverage [email or web address] Password [email or web address] 06/24/2018, 1:57 PM

## 2018-06-24 NOTE — Consult Note (Signed)
Subjective:   HPI  The patient is a 47 year old female who we are asked to see in consultation in regards to ongoing problems with diarrhea vomiting and abdominal pain. The patient states that she started to experience diarrhea 3 weeks ago. She noticed blood in the diarrhea. She had not been on any new medications and no one else around her was sick with similar issues. One week ago she started to experience vomiting associated with the diarrhea. She had an abdominal ultrasound which was normal. A CT angiomata of the chest did not show any evidence of pulmonary emboli and mention on the CT that the upper abdomen looked negative. She continues to experience diarrhea. She states that she had similar symptoms several years ago. In reviewing records it appears that in July 2015 she was seen for iron deficiency anemia and had a normal EGD done by Dr. Danise Edge. She also had a colonoscopy which visibly showed evidence of colitis and biopsies showed mild active chronic colitis. The patient and her husband states that they were unaware of any ulcers or any problems. They had no follow-up after that colonoscopy findings. I asked the patient if she had ever had colitis or anyone in her family had ever had colitis and she stated no. A GI pathogens panel and stool for C. Difficile were negative on this admission.  Review of Systems No chest pain or shortness of breath  Past Medical History:  Diagnosis Date  . Arthritis    both legs sciatic nerve pain at times  . Blood in stool    since april 2015, found microscopic  . GERD (gastroesophageal reflux disease)    Past Surgical History:  Procedure Laterality Date  . CESAREAN SECTION     x 2  . COLONOSCOPY WITH PROPOFOL N/A 05/06/2014   Procedure: COLONOSCOPY WITH PROPOFOL;  Surgeon: Charolett Bumpers, MD;  Location: [facility];  Service: Endoscopy;  Laterality: N/A;  . ESOPHAGOGASTRODUODENOSCOPY (EGD) WITH PROPOFOL N/A 05/06/2014   Procedure:  ESOPHAGOGASTRODUODENOSCOPY (EGD) WITH PROPOFOL;  Surgeon: Charolett Bumpers, MD;  Location: [facility];  Service: Endoscopy;  Laterality: N/A;   Social History   Socioeconomic History  . Marital status: Married    Spouse name: Not on file  . Number of children: Not on file  . Years of education: Not on file  . Highest education level: Not on file  Occupational History  . Not on file  Social Needs  . Financial resource strain: Not on file  . Food insecurity:    Worry: Not on file    Inability: Not on file  . Transportation needs:    Medical: Not on file    Non-medical: Not on file  Tobacco Use  . Smoking status: Never Smoker  . Smokeless tobacco: Never Used  Substance and Sexual Activity  . Alcohol use: No  . Drug use: No  . Sexual activity: Not on file  Lifestyle  . Physical activity:    Days per week: Not on file    Minutes per session: Not on file  . Stress: Not on file  Relationships  . Social connections:    Talks on phone: Not on file    Gets together: Not on file    Attends religious service: Not on file    Active member of club or organization: Not on file    Attends meetings of clubs or organizations: Not on file    Relationship status: Not on file  . Intimate partner violence:  Fear of current or ex partner: Not on file    Emotionally abused: Not on file    Physically abused: Not on file    Forced sexual activity: Not on file  Other Topics Concern  . Not on file  Social History Narrative  . Not on file   family history is not on file.  Current Facility-Administered Medications:  .  0.9 %  sodium chloride infusion, , Intravenous, PRN, Berton Mount I, MD, Last Rate: 10 mL/hr at 06/22/18 0615 .  0.9 %  sodium chloride infusion, , Intravenous, Continuous, Adhikari, Amrit, MD, Last Rate: 75 mL/hr at 06/24/18 1417 .  cyclobenzaprine (FLEXERIL) tablet 5 mg, 5 mg, Oral, QHS, Berton Mount I, MD, 5 mg at 06/22/18 2218 .  famotidine (PEPCID) IVPB 20  mg premix, 20 mg, Intravenous, Q12H, Berton Mount I, MD, Last Rate: 100 mL/hr at 06/24/18 0939, 20 mg at 06/24/18 0939 .  metoCLOPramide (REGLAN) injection 10 mg, 10 mg, Intravenous, Q8H, Adhikari, Amrit, MD, 10 mg at 06/24/18 1418 .  ondansetron (ZOFRAN) injection 4 mg, 4 mg, Intravenous, Q8H PRN, Berton Mount I, MD, 4 mg at 06/23/18 0759 .  phosphorus (K PHOS NEUTRAL) tablet 500 mg, 500 mg, Oral, TID, Renford Dills, Amrit, MD, 500 mg at 06/24/18 0935 .  promethazine (PHENERGAN) injection 12.5 mg, 12.5 mg, Intravenous, Q6H PRN, Adhikari, Amrit, MD, 12.5 mg at 06/23/18 1010 .  zolpidem (AMBIEN) tablet 5 mg, 5 mg, Oral, QHS PRN, Barnetta Chapel, MD Allergies  Allergen Reactions  . Penicillins Hives     Objective:     BP 135/84 (BP Location: Right Arm)   Pulse (!) 117   Temp 100 F (37.8 C) (Oral)   Resp 19   Ht 4\' 10"  (1.473 m)   Wt 85.9 kg   LMP 06/09/2018 Comment: neg preg test  SpO2 100%   BMI 39.58 kg/m   No acute distress  Heart regular rhythm no murmurs  Lungs clear  Abdomen: Bowel sounds present, soft, mild diffuse discomfort to palpation but no guarding or signs of peritonitis  Laboratory No components found for: D1    Assessment:     Diarrhea and rectal bleeding. I suspect this patient has colitis. This may be a recurrence of ulcerative colitis. Of note is the patient was unaware of this particular diagnosis and has never been treated for it.      Plan:     I think she is going to need an updated colonoscopy to further investigate as her last one was over 4 years ago. Further treatment will be based on colonoscopic findings. We discussed this but patient was unclear as to how she might be able to drink the prep because she is not feeling that well at this time. We will revisit this issue tomorrow.

## 2018-06-25 ENCOUNTER — Inpatient Hospital Stay (HOSPITAL_COMMUNITY): Payer: Managed Care, Other (non HMO)

## 2018-06-25 DIAGNOSIS — I5030 Unspecified diastolic (congestive) heart failure: Secondary | ICD-10-CM

## 2018-06-25 DIAGNOSIS — R000 Tachycardia, unspecified: Secondary | ICD-10-CM

## 2018-06-25 DIAGNOSIS — K51911 Ulcerative colitis, unspecified with rectal bleeding: Secondary | ICD-10-CM

## 2018-06-25 LAB — ECHOCARDIOGRAM COMPLETE
HEIGHTINCHES: 58 in
WEIGHTICAEL: 3030 [oz_av]

## 2018-06-25 LAB — BASIC METABOLIC PANEL
Anion gap: 12 (ref 5–15)
BUN: <5 mg/dL — ABNORMAL LOW (ref 6–20)
CHLORIDE: 104 mmol/L (ref 98–111)
CO2: 21 mmol/L — AB (ref 22–32)
CREATININE: 0.68 mg/dL (ref 0.44–1.00)
Calcium: 7.5 mg/dL — ABNORMAL LOW (ref 8.9–10.3)
GFR calc Af Amer: >60 mL/min (ref >60)
GFR calc non Af Amer: >60 mL/min (ref >60)
Glucose, Bld: 114 mg/dL — ABNORMAL HIGH (ref 70–99)
Potassium: 3.3 mmol/L — ABNORMAL LOW (ref 3.5–5.1)
Sodium: 137 mmol/L (ref 135–145)

## 2018-06-25 LAB — PHOSPHORUS: Phosphorus: 2.1 mg/dL — ABNORMAL LOW (ref 2.5–4.6)

## 2018-06-25 LAB — LACTIC ACID, PLASMA: Lactic Acid, Venous: 1.1 mmol/L (ref 0.5–1.9)

## 2018-06-25 MED ORDER — SODIUM CHLORIDE 0.9 % IV SOLN: INTRAVENOUS | Status: DC

## 2018-06-25 MED ORDER — MAGNESIUM CITRATE PO SOLN
1.0000 | Freq: Once | ORAL | Status: AC
  Administered 2018-06-25: 1 via ORAL
  Filled 2018-06-25: qty 296

## 2018-06-25 MED ORDER — POTASSIUM CHLORIDE CRYS ER 20 MEQ PO TBCR
40.0000 meq | EXTENDED_RELEASE_TABLET | Freq: Once | ORAL | Status: AC
  Administered 2018-06-25: 40 meq via ORAL
  Filled 2018-06-25: qty 2

## 2018-06-25 NOTE — Progress Notes (Signed)
The patient is doing the same today. Still having diarrhea and rectal bleeding. In reviewing records I think she probably has ulcerative colitis. I think this is probably the cause of her symptoms. We will plan colonoscopy tomorrow.

## 2018-06-25 NOTE — Progress Notes (Signed)
Pt with sustained Tachycardia low 140s. Also pt febrile( Oral temp 103.2 and PRN tylenol given . Provider notified with new orders: 0.9 % NS 1 L bolus and lactic acid drawn.  Temp of 99.6 on recheck

## 2018-06-25 NOTE — Progress Notes (Addendum)
PROGRESS NOTE    SHIFA KEADY  LTR:[id number] DOB: 06-21-1971 DOA: 06/21/2018 PCP: Patient, No Pcp Per   Brief Narrative: Patient is a 47 year-old female with past medical history of arthritis, GERD,colitis who presents with 2 weeks history of nausea, vomiting ,abdominal pain and diarrhea.  Hospital course was also remarkable for persistent sinus tachycardia.  She has positive  FOBT.  GI consulted and plan for colonoscopy tomorrow.  Assessment & Plan:   Principal Problem:   Colitis Active Problems:   Nausea vomiting and diarrhea   Sinus tachycardia  Presumed Colitis: Presented with nausea, vomiting,abdominal pain and diarrhea.  C. Difficile, GI pathogen, came out to be negative. We  started her on clear liquid diet.  Patient has remote history of colitis but was never treated.  GI consulted for positive FOBT.  Strong suspicion for ulcerative colitis.  Plan for colonoscopy tomorrow.  Nausea/vomiting: Continue Reglan, as needed promethazine Nausea and vomiting have improved this morning.  Diarrhea: Diarrhea has slowed down.  Patient complained of dark/rusty stools.  Positive  FOBT.  Hemoglobin is  Stable.  Sinus tachycardia: Thought secondary to volume depletion,pain, vomiting, diarrhea.  TSH is slightly below normal but T4 is normal.  Will check echocardiogram, free T3.  IV fluids discontinued today because of puffiness.  Fever: I suspect this is coming from her colitis.  Blood cultures have been taken.  Hypokalemia: Limited with potassium today  Hypophosphatemia: Continue supplementation.  Check phosphorus level tomorrow  DVT prophylaxis: SCD Code Status: Full Family Communication: None present at the bedside Disposition Plan: Likely home after resolution of nausea, vomiting,GI workup   Consultants: None Procedures: None  Antimicrobials:None  Subjective:  Patient seen and examined at bedside this afternoon.  Nausea, vomiting have improved.  Diarrhea has stopped.  Continues  to be in sinus tachycardia.  Continues to complain of abdominal discomfort.  Objective: Vitals:   06/24/18 2218 06/24/18 2344 06/25/18 0039 06/25/18 0456  BP: 132/86 122/80  134/72  Pulse: (!) 130 (!) 144  (!) 111  Resp:      Temp: (!) 102.3 F (39.1 C)  99.6 F (37.6 C) 98.3 F (36.8 C)  TempSrc: Oral  Oral Oral  SpO2: 98% 98%  98%  Weight:      Height:        Intake/Output Summary (Last 24 hours) at 06/25/2018 1339 Last data filed at 06/24/2018 1828 Gross per 24 hour  Intake 962.01 ml  Output -  Net 962.01 ml   Filed Weights   06/21/18 1904 06/22/18 0541  Weight: 83.9 kg 85.9 kg    Examination:  General exam: Appears calm and comfortable ,Not in distress,obese HEENT:PERRL,Oral mucosa moist, Ear/Nose normal on gross exam Respiratory system: Bilateral equal air entry, normal vesicular breath sounds, no wheezes or crackles  Cardiovascular system: S1 & S2 heard, RRR. No JVD, murmurs, rubs, gallops or clicks. Gastrointestinal system: Abdomen is nondistended, soft . No organomegaly or masses felt. Normal bowel sounds heard.  Mild generalized tenderness Central nervous system: Alert and oriented. No focal neurological deficits. Extremities: No edema, no clubbing ,no cyanosis, distal peripheral pulses palpable. Skin: No rashes, lesions or ulcers,no icterus ,no pallor Psychiatry: Judgement and insight appear normal. Mood & affect appropriate.     Data Reviewed: I have personally reviewed following labs and imaging studies  CBC: Recent Labs  Lab 06/21/18 1913 06/22/18 0538 06/24/18 1315  WBC 10.4 9.1 7.8  NEUTROABS  --   --  5.2  HGB 10.4* 9.8* 9.2*  HCT  34.4* 32.7* 31.1*  MCV 75.9* 77.1* 77.0*  PLT 437* 400 290   Basic Metabolic Panel: Recent Labs  Lab 06/22/18 0538 06/22/18 1327 06/22/18 1733 06/24/18 1315 06/25/18 0041  NA 139 140 138 138 137  K 3.0* 3.7 4.1 5.1 3.3*  CL 105 109 108 106 104  CO2 20* 20* 20* 25 21*  GLUCOSE 86 96 88 123* 114*  BUN <5* <5*  <5* <5* <5*  CREATININE 0.71 0.66 0.62 0.54 0.68  CALCIUM 8.0* 8.1* 7.8* 8.0* 7.5*  MG 1.8 2.2  --   --   --   PHOS  --  1.5* 1.7*  1.5* 1.9* 2.1*   GFR: Estimated Creatinine Clearance: 80 mL/min (by C-G formula based on SCr of 0.68 mg/dL). Liver Function Tests: Recent Labs  Lab 06/21/18 1913 06/22/18 1327 06/22/18 1733  AST 13*  --   --   ALT 12  --   --   ALKPHOS 114  --   --   BILITOT 1.2  --   --   PROT 7.6  --   --   ALBUMIN 2.4* 2.0* 1.9*   Recent Labs  Lab 06/21/18 1913  LIPASE 22   No results for input(s): AMMONIA in the last 168 hours. Coagulation Profile: Recent Labs  Lab 06/22/18 0538  INR 1.25   Cardiac Enzymes: Recent Labs  Lab 06/24/18 1315  TROPONINI <0.03   BNP (last 3 results) No results for input(s): PROBNP in the last 8760 hours. HbA1C: Recent Labs    06/24/18 1315  HGBA1C 5.7*   CBG: No results for input(s): GLUCAP in the last 168 hours. Lipid Profile: No results for input(s): CHOL, HDL, LDLCALC, TRIG, CHOLHDL, LDLDIRECT in the last 72 hours. Thyroid Function Tests: Recent Labs    06/22/18 1733  TSH 0.330*   Anemia Panel: No results for input(s): VITAMINB12, FOLATE, FERRITIN, TIBC, IRON, RETICCTPCT in the last 72 hours. Sepsis Labs: Recent Labs  Lab 06/21/18 2356 06/25/18 0028  LATICACIDVEN 0.89 1.1    Recent Results (from the past 240 hour(s))  C difficile quick scan w PCR reflex     Status: None   Collection Time: 06/22/18  4:48 AM  Result Value Ref Range Status   C Diff antigen NEGATIVE NEGATIVE Final   C Diff toxin NEGATIVE NEGATIVE Final   C Diff interpretation No C. difficile detected.  Final    Comment: Performed at [facility], [address] [place], [place] [address]  Gastrointestinal Panel by PCR , Stool     Status: None   Collection Time: 06/22/18  4:49 AM  Result Value Ref Range Status   Campylobacter species NOT DETECTED NOT DETECTED Final   Plesimonas shigelloides NOT DETECTED NOT DETECTED Final    Salmonella species NOT DETECTED NOT DETECTED Final   Yersinia enterocolitica NOT DETECTED NOT DETECTED Final   Vibrio species NOT DETECTED NOT DETECTED Final   Vibrio cholerae NOT DETECTED NOT DETECTED Final   Enteroaggregative E coli (EAEC) NOT DETECTED NOT DETECTED Final   Enteropathogenic E coli (EPEC) NOT DETECTED NOT DETECTED Final   Enterotoxigenic E coli (ETEC) NOT DETECTED NOT DETECTED Final   Shiga like toxin producing E coli (STEC) NOT DETECTED NOT DETECTED Final   Shigella/Enteroinvasive E coli (EIEC) NOT DETECTED NOT DETECTED Final   Cryptosporidium NOT DETECTED NOT DETECTED Final   Cyclospora cayetanensis NOT DETECTED NOT DETECTED Final   Entamoeba histolytica NOT DETECTED NOT DETECTED Final   Giardia lamblia NOT DETECTED NOT DETECTED Final  Adenovirus F40/41 NOT DETECTED NOT DETECTED Final   Astrovirus NOT DETECTED NOT DETECTED Final   Norovirus GI/GII NOT DETECTED NOT DETECTED Final   Rotavirus A NOT DETECTED NOT DETECTED Final   Sapovirus (I, II, IV, and V) NOT DETECTED NOT DETECTED Final    Comment: Performed at [facility], [address], [place], [place] [address]         Radiology Studies: No results found.      Scheduled Meds: . cyclobenzaprine  5 mg Oral QHS  . magnesium citrate  1 Bottle Oral Once  . metoCLOPramide (REGLAN) injection  10 mg Intravenous Q8H  . phosphorus  500 mg Oral TID   Continuous Infusions: . sodium chloride 10 mL/hr at 06/22/18 0615  . sodium chloride    . famotidine (PEPCID) IV 20 mg (06/25/18 1212)     LOS: 2 days    Time spent: 25 mins.More than 50% of that time was spent in counseling and/or coordination of care.      Burnadette Pop, MD [facility] Pager [phone]  If 7PM-7AM, please contact night-coverage [email or web address] Password [email or web address] 06/25/2018, 1:39 PM

## 2018-06-25 NOTE — Progress Notes (Signed)
  Echocardiogram 2D Echocardiogram has been performed.  Katrina Oconnor 06/25/2018, 11:09 AM

## 2018-06-26 ENCOUNTER — Encounter (HOSPITAL_COMMUNITY)
Admission: EM | Disposition: A | Discharge status: Home / Self Care | Payer: Self-pay | Source: Home / Self Care | Attending: Internal Medicine

## 2018-06-26 ENCOUNTER — Encounter (HOSPITAL_COMMUNITY): Payer: Self-pay | Admitting: *Deleted

## 2018-06-26 ENCOUNTER — Inpatient Hospital Stay (HOSPITAL_COMMUNITY): Payer: Managed Care, Other (non HMO) | Admitting: Certified Registered Nurse Anesthetist

## 2018-06-26 HISTORY — PX: BIOPSY: SHX5522

## 2018-06-26 HISTORY — PX: COLONOSCOPY WITH PROPOFOL: SHX5780

## 2018-06-26 LAB — O&P RESULT

## 2018-06-26 LAB — OVA + PARASITE EXAM

## 2018-06-26 LAB — T3, FREE: T3, Free: 1.5 pg/mL — ABNORMAL LOW (ref 2.0–4.4)

## 2018-06-26 SURGERY — COLONOSCOPY WITH PROPOFOL
Anesthesia: Monitor Anesthesia Care

## 2018-06-26 MED ORDER — PROPOFOL 500 MG/50ML IV EMUL
INTRAVENOUS | Status: DC | PRN
  Administered 2018-06-26: 100 ug/kg/min via INTRAVENOUS

## 2018-06-26 MED ORDER — MESALAMINE 1.2 G PO TBEC
4.8000 g | DELAYED_RELEASE_TABLET | Freq: Every day | ORAL | Status: DC
  Administered 2018-06-26: 4.8 g via ORAL
  Filled 2018-06-26 (×2): qty 4

## 2018-06-26 MED ORDER — METHYLPREDNISOLONE SODIUM SUCC 40 MG IJ SOLR
40.0000 mg | Freq: Two times a day (BID) | INTRAMUSCULAR | Status: DC
  Administered 2018-06-26 (×2): 40 mg via INTRAVENOUS
  Filled 2018-06-26 (×3): qty 1

## 2018-06-26 MED ORDER — LIDOCAINE 2% (20 MG/ML) 5 ML SYRINGE
INTRAMUSCULAR | Status: DC | PRN
  Administered 2018-06-26: 40 mg via INTRAVENOUS

## 2018-06-26 MED ORDER — LACTATED RINGERS IV SOLN
INTRAVENOUS | Status: DC | PRN
  Administered 2018-06-26: 10:00:00 via INTRAVENOUS

## 2018-06-26 MED ORDER — BLISTEX MEDICATED EX OINT
TOPICAL_OINTMENT | CUTANEOUS | Status: DC | PRN
  Filled 2018-06-26 (×2): qty 6.3

## 2018-06-26 MED ORDER — LEVOFLOXACIN IN D5W 750 MG/150ML IV SOLN
750.0000 mg | INTRAVENOUS | Status: DC
  Administered 2018-06-26: 750 mg via INTRAVENOUS
  Filled 2018-06-26 (×2): qty 150

## 2018-06-26 MED ORDER — PROPOFOL 10 MG/ML IV BOLUS
INTRAVENOUS | Status: DC | PRN
  Administered 2018-06-26: 30 mg via INTRAVENOUS

## 2018-06-26 MED ORDER — METRONIDAZOLE IN NACL 5-0.79 MG/ML-% IV SOLN
500.0000 mg | Freq: Three times a day (TID) | INTRAVENOUS | Status: DC
  Administered 2018-06-26 – 2018-06-27 (×3): 500 mg via INTRAVENOUS
  Filled 2018-06-26 (×4): qty 100

## 2018-06-26 MED ORDER — POTASSIUM CHLORIDE IN NACL 20-0.9 MEQ/L-% IV SOLN
INTRAVENOUS | Status: DC
  Administered 2018-06-26: 1000 mL via INTRAVENOUS
  Administered 2018-06-27: via INTRAVENOUS
  Filled 2018-06-26 (×3): qty 1000

## 2018-06-26 SURGICAL SUPPLY — 22 items

## 2018-06-26 NOTE — Progress Notes (Signed)
Informed by the phlebotomist that the patient is requesting 2 specific phlebotomists to draw her morning labs, neither of whom are working today.  Patient is refusing lab draws from any other phlebotomist.  [facility] provider C. Bodenheimer notified.  Will continue to monitor.

## 2018-06-26 NOTE — Transfer of Care (Signed)
Immediate Anesthesia Transfer of Care Note  Patient: Ercelle L Soderlund  Procedure(s) Performed: COLONOSCOPY WITH PROPOFOL (N/A ) BIOPSY  Patient Location: Endoscopy Unit  Anesthesia Type:MAC  Level of Consciousness: awake, alert  and oriented  Airway & Oxygen Therapy: Patient Spontanous Breathing  Post-op Assessment: Report given to RN and Post -op Vital signs reviewed and stable  Post vital signs: Reviewed and stable  Last Vitals:  Vitals Value Taken Time  BP 126/71 06/26/2018 10:33 AM  Temp    Pulse 124 06/26/2018 10:33 AM  Resp 16 06/26/2018 10:33 AM  SpO2 100 % 06/26/2018 10:33 AM  Vitals shown include unvalidated device data.  Last Pain:  Vitals:   06/26/18 0912  TempSrc: Oral  PainSc: 0-No pain      Patients Stated Pain Goal: 0 (85/46/27 0350)  Complications: No apparent anesthesia complications

## 2018-06-26 NOTE — H&P (Signed)
The patient presents to the endoscopy department for colonoscopy because of several weeks of diarrhea and rectal bleeding.  Physical  No distress  Heart regular rhythm no murmurs  Lungs clear  Abdomen soft and nontender  Impression: Diarrhea and rectal bleeding rule out colitis  Plan: Colonoscopy

## 2018-06-26 NOTE — Anesthesia Procedure Notes (Signed)
Procedure Name: MAC Performed by: Valda Favia, CRNA Pre-anesthesia Checklist: Patient identified, Emergency Drugs available, Suction available, Patient being monitored and Timeout performed Patient Re-evaluated:Patient Re-evaluated prior to induction Oxygen Delivery Method: Simple face mask Preoxygenation: Pre-oxygenation with 100% oxygen Placement Confirmation: positive ETCO2 Dental Injury: Teeth and Oropharynx as per pre-operative assessment

## 2018-06-26 NOTE — Progress Notes (Addendum)
Pharmacy Antibiotic Note  Katrina Oconnor is a 47 y.o. female admitted on 06/21/2018 with presumed colitis.  Pharmacy has been consulted for Levaquin dosing.  Tmax 101.2 F, WBCs normal, GI panel unremarkable, C diff neg, cbx collected 9/9, ova & parasite exam pending (collected 9/6).    Plan: Initiate Levaquin 750 mg IV QD for empiric tx of intra-abdominal infection. Monitor renal fxn, GI sx, EKG abnormalities.  Height: 4\' 10"  (147.3 cm) Weight: 182 lb (82.6 kg) IBW/kg (Calculated) : 40.9  Temp (24hrs), Avg:100.3 F (37.9 C), Min:98.2 F (36.8 C), Max:101.2 F (38.4 C)  Recent Labs  Lab 06/21/18 1913 06/21/18 2356 06/22/18 0538 06/22/18 1327 06/22/18 1733 06/24/18 1315 06/25/18 0028 06/25/18 0041  WBC 10.4  --  9.1  --   --  7.8  --   --   CREATININE 0.87  --  0.71 0.66 0.62 0.54  --  0.68  LATICACIDVEN  --  0.89  --   --   --   --  1.1  --     Estimated Creatinine Clearance: 78.2 mL/min (by C-G formula based on SCr of 0.68 mg/dL).    Allergies  Allergen Reactions  . Penicillins Hives    Antimicrobials this admission: Metronidazole 9/10 >> Levofloxacin 9/10 >>  Thank you for allowing pharmacy to be a part of this patient's care.  Marian Sorrow, PharmD Student 06/26/2018 11:22 AM   I discussed / reviewed the pharmacy note by Dr. Marvel Plan and I agree with the student's findings and plans as documented. Lamond Glantz S. Merilynn Finland, PharmD, BCPS Clinical Staff Pharmacist Pager [phone]

## 2018-06-26 NOTE — Progress Notes (Signed)
[facility] PROGRESS NOTE  Katrina Oconnor:[id number] DOB: February 01, 1971 DOA: 06/21/2018 PCP: Patient, No Pcp Per  Brief summary   47 year-old female with past medical history of arthritis, GERD,colitis who presents with 2 weeks history of nausea, vomiting ,abdominal pain and diarrhea.  Hospital course was also remarkable for persistent sinus tachycardia.  She has positive  FOBT.  GI consulted and plan for colonoscopy today    Assessment/Plan:  Presumed Colitis: Presented with nausea, vomiting, abdominal pain and diarrhea.  C. Difficile, GI pathogen, came out to be negative. Patient has remote history of colitis but was never treated.   -GI consulted for positive FOBT.  Strong suspicion for ulcerative colitis.  Plan for colonoscopy today. Check sed rate, CRP  Nausea/vomiting: Continue Reglan, as needed promethazine prn.   Diarrhea: Diarrhea has slowed down.  Patient complained of dark/rusty stools.  Positive  FOBT. Hemoglobin is  Stable.  Sinus tachycardia: Thought secondary to volume depletion,pain, vomiting, diarrhea.  TSH is slightly below normal but T4 is normal. Echo: LVE 60%  to 65%. Wall motion was normal;monitor   Fever: possible from presumed colitis.  Blood cultures: NGTD. Lactate 1.1. No acute respiratory symptoms.  Started on iv antibiotics, check inflammatory markers sed rate.  Monitor   Hypokalemia:recheck AM  Hypophosphatemia: Continue supplementation.     Code Status: full Family Communication: d/w patient, RN (indicate person spoken with, relationship, and if by phone, the number) Disposition Plan: home when stable, afebrile    Consultants:  GI  Procedures:  Colonoscopy today   Antibiotics:  Flagyl/levofloxa cin 9/10>> (indicate start date, and stop date if known)  HPI/Subjective: Reports 5-6 episodes of diarrhea. No vomiting. Awaiting colonoscopy   Objective: Vitals:   06/25/18 2138 06/26/18 0442  BP: 110/64 106/64  Pulse: (!) 120 (!) 114   Resp: 20 20  Temp: (!) 101.1 F (38.4 C) 98.2 F (36.8 C)  SpO2: 95% 98%    Intake/Output Summary (Last 24 hours) at 06/26/2018 0911 Last data filed at 06/26/2018 0305 Gross per 24 hour  Intake 1541.76 ml  Output -  Net 1541.76 ml   Filed Weights   06/21/18 1904 06/22/18 0541  Weight: 83.9 kg 85.9 kg    Exam:   General:  No distress   Cardiovascular: s1,s2 mild tachycardia   Respiratory: CTA BL  Abdomen: soft, mild lower abd tender, no rebound   Musculoskeletal: non pitting pedal edema    Data Reviewed: Basic Metabolic Panel: Recent Labs  Lab 06/22/18 0538 06/22/18 1327 06/22/18 1733 06/24/18 1315 06/25/18 0041  NA 139 140 138 138 137  K 3.0* 3.7 4.1 5.1 3.3*  CL 105 109 108 106 104  CO2 20* 20* 20* 25 21*  GLUCOSE 86 96 88 123* 114*  BUN <5* <5* <5* <5* <5*  CREATININE 0.71 0.66 0.62 0.54 0.68  CALCIUM 8.0* 8.1* 7.8* 8.0* 7.5*  MG 1.8 2.2  --   --   --   PHOS  --  1.5* 1.7*  1.5* 1.9* 2.1*   Liver Function Tests: Recent Labs  Lab 06/21/18 1913 06/22/18 1327 06/22/18 1733  AST 13*  --   --   ALT 12  --   --   ALKPHOS 114  --   --   BILITOT 1.2  --   --   PROT 7.6  --   --   ALBUMIN 2.4* 2.0* 1.9*   Recent Labs  Lab 06/21/18 1913  LIPASE 22   No results for input(s): AMMONIA in  the last 168 hours. CBC: Recent Labs  Lab 06/21/18 1913 06/22/18 0538 06/24/18 1315  WBC 10.4 9.1 7.8  NEUTROABS  --   --  5.2  HGB 10.4* 9.8* 9.2*  HCT 34.4* 32.7* 31.1*  MCV 75.9* 77.1* 77.0*  PLT 437* 400 290   Cardiac Enzymes: Recent Labs  Lab 06/24/18 1315  TROPONINI <0.03   BNP (last 3 results) No results for input(s): BNP in the last 8760 hours.  ProBNP (last 3 results) No results for input(s): PROBNP in the last 8760 hours.  CBG: No results for input(s): GLUCAP in the last 168 hours.  Recent Results (from the past 240 hour(s))  C difficile quick scan w PCR reflex     Status: None   Collection Time: 06/22/18  4:48 AM  Result Value Ref  Range Status   C Diff antigen NEGATIVE NEGATIVE Final   C Diff toxin NEGATIVE NEGATIVE Final   C Diff interpretation No C. difficile detected.  Final    Comment: Performed at [facility], [address], [place], [place] [address]  Gastrointestinal Panel by PCR , Stool     Status: None   Collection Time: 06/22/18  4:49 AM  Result Value Ref Range Status   Campylobacter species NOT DETECTED NOT DETECTED Final   Plesimonas shigelloides NOT DETECTED NOT DETECTED Final   Salmonella species NOT DETECTED NOT DETECTED Final   Yersinia enterocolitica NOT DETECTED NOT DETECTED Final   Vibrio species NOT DETECTED NOT DETECTED Final   Vibrio cholerae NOT DETECTED NOT DETECTED Final   Enteroaggregative E coli (EAEC) NOT DETECTED NOT DETECTED Final   Enteropathogenic E coli (EPEC) NOT DETECTED NOT DETECTED Final   Enterotoxigenic E coli (ETEC) NOT DETECTED NOT DETECTED Final   Shiga like toxin producing E coli (STEC) NOT DETECTED NOT DETECTED Final   Shigella/Enteroinvasive E coli (EIEC) NOT DETECTED NOT DETECTED Final   Cryptosporidium NOT DETECTED NOT DETECTED Final   Cyclospora cayetanensis NOT DETECTED NOT DETECTED Final   Entamoeba histolytica NOT DETECTED NOT DETECTED Final   Giardia lamblia NOT DETECTED NOT DETECTED Final   Adenovirus F40/41 NOT DETECTED NOT DETECTED Final   Astrovirus NOT DETECTED NOT DETECTED Final   Norovirus GI/GII NOT DETECTED NOT DETECTED Final   Rotavirus A NOT DETECTED NOT DETECTED Final   Sapovirus (I, II, IV, and V) NOT DETECTED NOT DETECTED Final    Comment: Performed at [facility], [address] [place], [place] [address]  Culture, blood (routine x 2)     Status: None (Preliminary result)   Collection Time: 06/25/18  8:29 AM  Result Value Ref Range Status   Specimen Description BLOOD SITE NOT SPECIFIED  Final   Special Requests   Final    BOTTLES DRAWN AEROBIC ONLY Blood Culture adequate volume   Culture   Final    NO GROWTH < 24  HOURS Performed at [facility], [address], [place], [place] [address]    Report Status PENDING  Incomplete  Culture, blood (routine x 2)     Status: None (Preliminary result)   Collection Time: 06/25/18  8:29 AM  Result Value Ref Range Status   Specimen Description BLOOD SITE NOT SPECIFIED  Final   Special Requests   Final    BOTTLES DRAWN AEROBIC ONLY Blood Culture results may not be optimal due to an inadequate volume of blood received in culture bottles   Culture   Final    NO GROWTH < 24 HOURS  Performed at [facility], [address] [place], [place] [address]    Report Status PENDING  Incomplete     Studies: No results found.  Scheduled Meds: . [MAR Hold] cyclobenzaprine  5 mg Oral QHS  . [MAR Hold] metoCLOPramide (REGLAN) injection  10 mg Intravenous Q8H  . [MAR Hold] phosphorus  500 mg Oral TID   Continuous Infusions: . [MAR Hold] sodium chloride Stopped (06/25/18 2309)  . sodium chloride    . [MAR Hold] famotidine (PEPCID) IV Stopped (06/25/18 2301)    Principal Problem:   Colitis Active Problems:   Nausea vomiting and diarrhea   Sinus tachycardia    Time spent: >35 minutes     Esperanza Sheets  [facility] Pager [phone]. If 7PM-7AM, please contact night-coverage at [email or web address], password [facility] 06/26/2018, 9:11 AM  LOS: 3 days

## 2018-06-26 NOTE — Op Note (Signed)
[facility] Patient Name: Katrina Oconnor Procedure Date : 06/26/2018 MRN: [id number] Attending MD: Graylin Shiver , MD Date of Birth: 08-Nov-1970 CSN: [id number] Age: 47 Admit Type: Inpatient Procedure:                Colonoscopy Indications:              Rectal bleeding, Diarrhea Providers:                Graylin Shiver, MD, Clearnce Sorrel, RN, Beryle Beams, Technician, Irean Hong, CRNA Referring MD:              Medicines:                Propofol per Anesthesia Complications:            No immediate complications. Estimated Blood Loss:     Estimated blood loss: none. Procedure:                Pre-Anesthesia Assessment:                           - Prior to the procedure, a History and Physical                            was performed, and patient medications and                            allergies were reviewed. The patient's tolerance of                            previous anesthesia was also reviewed. The risks                            and benefits of the procedure and the sedation                            options and risks were discussed with the patient.                            All questions were answered, and informed consent                            was obtained. Prior Anticoagulants: The patient has                            taken no previous anticoagulant or antiplatelet                            agents. ASA Grade Assessment: II - A patient with                            mild systemic disease. After reviewing the risks  and benefits, the patient was deemed in                            satisfactory condition to undergo the procedure.                           After obtaining informed consent, the colonoscope                            was passed under direct vision. Throughout the                            procedure, the patient's blood pressure, pulse, and                            oxygen  saturations were monitored continuously. The                            [id number]-[id number] ([id number]) peds colon was introduced                            through the anus and advanced to the the terminal                            ileum. The terminal ileum, ileocecal valve,                            appendiceal orifice, and rectum were photographed.                            The colonoscopy was performed without difficulty.                            The patient tolerated the procedure well. The                            quality of the bowel preparation was adequate. Scope In: 10:09:43 AM Scope Out: 10:27:30 AM Scope Withdrawal Time: 0 hours 13 minutes 33 seconds  Total Procedure Duration: 0 hours 17 minutes 47 seconds  Findings:      The perianal and digital rectal examinations were normal.      Inflammation characterized by congestion (edema), friability, loss of       vascularity, deep ulcerations and shallow ulcerations was found. No       sites were spared. This was severe. Biopsies were taken with a cold       forceps for histology. 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 Blood in colon present. Clot at       base of cecum.      The terminal ileum appeared normal. Biopsies were taken with a cold       forceps for histology. Impression:               - Pancolitis. Inflammation was found. This was  severe. Biopsied.                           - The examined portion of the ileum was normal.                            Biopsied. Recommendation:           - Advance diet as tolerated.                           - Begin Mesalamine. Begin solumedrol. Check                            biopsies.                           - Repeat colonoscopy at appointment to be scheduled                            to evaluate the response to therapy. Procedure Code(s):        --- Professional ---                           [phone],  Colonoscopy, flexible; with biopsy, single                            or multiple Diagnosis Code(s):        --- Professional ---                           K52.9, Noninfective gastroenteritis and colitis,                            unspecified                           K62.5, Hemorrhage of anus and rectum                           R19.7, Diarrhea, unspecified CPT copyright 2017 [employer]. All rights reserved. The codes documented in this report are preliminary and upon coder review may  be revised to meet current compliance requirements. Graylin Shiver, MD 06/26/2018 10:37:40 AM This report has been signed electronically. Number of Addenda: 0

## 2018-06-26 NOTE — Anesthesia Preprocedure Evaluation (Addendum)
Anesthesia Evaluation  Patient identified by MRN, date of birth, ID band Patient awake    Reviewed: Allergy & Precautions, NPO status , Patient's Chart, lab work & pertinent test results  History of Anesthesia Complications Negative for: history of anesthetic complications  Airway Mallampati: II  TM Distance: >3 FB Neck ROM: Full    Dental  (+) Dental Advisory Given, Missing, Chipped   Pulmonary neg pulmonary ROS,    breath sounds clear to auscultation       Cardiovascular (-) angina Rhythm:Regular Rate:Normal  06/25/18 ECHO: EF 60-65%, valves OK   Neuro/Psych negative neurological ROS     GI/Hepatic Neg liver ROS, GERD  Medicated and Controlled,  Endo/Other  negative endocrine ROS  Renal/GU negative Renal ROS     Musculoskeletal  (+) Arthritis ,   Abdominal (+) + obese,   Peds  Hematology  (+) Blood dyscrasia (Hb 9.2), anemia ,   Anesthesia Other Findings   Reproductive/Obstetrics LMP 06/09/18                            Anesthesia Physical Anesthesia Plan  ASA: II  Anesthesia Plan: MAC   Post-op Pain Management:    Induction:   PONV Risk Score and Plan: 2 and Treatment may vary due to age or medical condition  Airway Management Planned: Natural Airway and Nasal Cannula  Additional Equipment:   Intra-op Plan:   Post-operative Plan:   Informed Consent: I have reviewed the patients History and Physical, chart, labs and discussed the procedure including the risks, benefits and alternatives for the proposed anesthesia with the patient or authorized representative who has indicated his/her understanding and acceptance.   Dental advisory given  Plan Discussed with: CRNA and Surgeon  Anesthesia Plan Comments: (Plan routine monitors, MAC)       Anesthesia Quick Evaluation

## 2018-06-26 NOTE — Anesthesia Postprocedure Evaluation (Signed)
Anesthesia Post Note  Patient: Katrina Oconnor  Procedure(s) Performed: COLONOSCOPY WITH PROPOFOL (N/A ) BIOPSY     Patient location during evaluation: Endoscopy Anesthesia Type: MAC Level of consciousness: awake and alert, oriented and patient cooperative Pain management: pain level controlled Vital Signs Assessment: post-procedure vital signs reviewed and stable Respiratory status: spontaneous breathing, nonlabored ventilation and respiratory function stable Cardiovascular status: blood pressure returned to baseline and stable Postop Assessment: no apparent nausea or vomiting Anesthetic complications: no    Last Vitals:  Vitals:   06/26/18 1240 06/26/18 1320  BP:  (!) 114/56  Pulse:  (!) 125  Resp:  16  Temp: 37.7 C 37.1 C  SpO2:  100%    Last Pain:  Vitals:   06/26/18 1320  TempSrc: Oral  PainSc:                  Carols Clemence,E. Rona Tomson

## 2018-06-27 ENCOUNTER — Encounter (HOSPITAL_COMMUNITY): Payer: Self-pay | Admitting: Gastroenterology

## 2018-06-27 DIAGNOSIS — E876 Hypokalemia: Secondary | ICD-10-CM

## 2018-06-27 DIAGNOSIS — K529 Noninfective gastroenteritis and colitis, unspecified: Secondary | ICD-10-CM

## 2018-06-27 DIAGNOSIS — D62 Acute posthemorrhagic anemia: Secondary | ICD-10-CM

## 2018-06-27 LAB — BASIC METABOLIC PANEL
ANION GAP: 8 (ref 5–15)
BUN: 5 mg/dL — ABNORMAL LOW (ref 6–20)
CHLORIDE: 110 mmol/L (ref 98–111)
CO2: 22 mmol/L (ref 22–32)
Calcium: 7.9 mg/dL — ABNORMAL LOW (ref 8.9–10.3)
Creatinine, Ser: 0.65 mg/dL (ref 0.44–1.00)
GFR calc Af Amer: >60 mL/min (ref >60)
GFR calc non Af Amer: >60 mL/min (ref >60)
GLUCOSE: 115 mg/dL — AB (ref 70–99)
POTASSIUM: 3.9 mmol/L (ref 3.5–5.1)
Sodium: 140 mmol/L (ref 135–145)

## 2018-06-27 LAB — PREPARE RBC (CROSSMATCH)

## 2018-06-27 LAB — CBC
HEMATOCRIT: 21.1 % — AB (ref 36.0–46.0)
HEMOGLOBIN: 6.5 g/dL — AB (ref 12.0–15.0)
MCH: 23 pg — AB (ref 26.0–34.0)
MCHC: 30.8 g/dL (ref 30.0–36.0)
MCV: 74.8 fL — AB (ref 78.0–100.0)
Platelets: 146 10*3/uL — ABNORMAL LOW (ref 150–400)
RBC: 2.82 MIL/uL — ABNORMAL LOW (ref 3.87–5.11)
RDW: 16.2 % — ABNORMAL HIGH (ref 11.5–15.5)
WBC: 8.3 10*3/uL (ref 4.0–10.5)

## 2018-06-27 LAB — SEDIMENTATION RATE: Sed Rate: 78 mm/hr — ABNORMAL HIGH (ref 0–22)

## 2018-06-27 LAB — C-REACTIVE PROTEIN: CRP: 23.7 mg/dL — ABNORMAL HIGH (ref <1.0)

## 2018-06-27 MED ORDER — METOCLOPRAMIDE HCL 5 MG/ML IJ SOLN: 10.0000 mg | Freq: Three times a day (TID) | INTRAMUSCULAR | Status: DC | PRN

## 2018-06-27 MED ORDER — SODIUM CHLORIDE 0.9% FLUSH
10.0000 mL | Freq: Two times a day (BID) | INTRAVENOUS | Status: DC
  Administered 2018-06-27 – 2018-06-28 (×2): 10 mL

## 2018-06-27 MED ORDER — PANTOPRAZOLE SODIUM 40 MG PO TBEC
40.0000 mg | DELAYED_RELEASE_TABLET | Freq: Every day | ORAL | Status: DC
  Filled 2018-06-27: qty 1

## 2018-06-27 MED ORDER — BOOST / RESOURCE BREEZE PO LIQD CUSTOM
1.0000 | Freq: Two times a day (BID) | ORAL | Status: DC
  Administered 2018-06-27 – 2018-06-28 (×2): 1 via ORAL

## 2018-06-27 MED ORDER — DIPHENHYDRAMINE HCL 50 MG/ML IJ SOLN
25.0000 mg | Freq: Once | INTRAMUSCULAR | Status: AC
  Administered 2018-06-27: 25 mg via INTRAVENOUS
  Filled 2018-06-27: qty 1

## 2018-06-27 MED ORDER — PREDNISONE 20 MG PO TABS
40.0000 mg | ORAL_TABLET | Freq: Every day | ORAL | Status: DC
  Administered 2018-06-28: 40 mg via ORAL
  Filled 2018-06-27: qty 2

## 2018-06-27 MED ORDER — SODIUM CHLORIDE 0.9% FLUSH: 10.0000 mL | INTRAVENOUS | Status: DC | PRN

## 2018-06-27 MED ORDER — SODIUM CHLORIDE 0.9% IV SOLUTION
Freq: Once | INTRAVENOUS | Status: AC
  Administered 2018-06-27: 16:00:00 via INTRAVENOUS

## 2018-06-27 MED ORDER — MESALAMINE ER 250 MG PO CPCR
1000.0000 mg | ORAL_CAPSULE | Freq: Four times a day (QID) | ORAL | Status: DC
  Filled 2018-06-27 (×8): qty 4

## 2018-06-27 MED ORDER — FUROSEMIDE 10 MG/ML IJ SOLN
20.0000 mg | Freq: Once | INTRAMUSCULAR | Status: DC
  Filled 2018-06-27 (×2): qty 2

## 2018-06-27 MED ORDER — ACETAMINOPHEN 325 MG PO TABS
650.0000 mg | ORAL_TABLET | Freq: Once | ORAL | Status: AC
  Administered 2018-06-27: 650 mg via ORAL
  Filled 2018-06-27: qty 2

## 2018-06-27 NOTE — Progress Notes (Addendum)
Pt's RFA PIV, blew during 1st unit of blood administration. This nurse attempt x 2, unsuccessful.  Call placed to IV team to place PIV, IV team unsuccessful and recommends midline IV. MD notified and request for midline via amion.  [id number]

## 2018-06-27 NOTE — Progress Notes (Signed)
[facility] Progress Note  Subjective: The patient feels good today. She has felt better since starting the steroids. Her hemoglobin and hematocrit dropped however and she is receiving 2 units of packed red cells today.  Objective: Vital signs in last 24 hours: Temp:  [98 F (36.7 C)-98.2 F (36.8 C)] 98 F (36.7 C) (09/11 1600) Pulse Rate:  [89-105] 89 (09/11 1600) Resp:  [16-18] 18 (09/11 1545) BP: (101-118)/(51-72) 118/60 (09/11 1600) SpO2:  [100 %] 100 % (09/11 1600) Weight change:    PE:  Abdomen nontender  Lab Results: Results for orders placed or performed during the hospital encounter of 06/21/18 (from the past 24 hour(s))  Basic metabolic panel     Status: Abnormal   Collection Time: 06/27/18  9:13 AM  Result Value Ref Range   Sodium 140 135 - 145 mmol/L   Potassium 3.9 3.5 - 5.1 mmol/L   Chloride 110 98 - 111 mmol/L   CO2 22 22 - 32 mmol/L   Glucose, Bld 115 (H) 70 - 99 mg/dL   BUN 5 (L) 6 - 20 mg/dL   Creatinine, Ser 5.78 0.44 - 1.00 mg/dL   Calcium 7.9 (L) 8.9 - 10.3 mg/dL   GFR calc non Af Amer >60 >60 mL/min   GFR calc Af Amer >60 >60 mL/min   Anion gap 8 5 - 15  CBC     Status: Abnormal   Collection Time: 06/27/18  9:13 AM  Result Value Ref Range   WBC 8.3 4.0 - 10.5 K/uL   RBC 2.82 (L) 3.87 - 5.11 MIL/uL   Hemoglobin 6.5 (LL) 12.0 - 15.0 g/dL   HCT 46.9 (L) 62.9 - 52.8 %   MCV 74.8 (L) 78.0 - 100.0 fL   MCH 23.0 (L) 26.0 - 34.0 pg   MCHC 30.8 30.0 - 36.0 g/dL   RDW 41.3 (H) 24.4 - 01.0 %   Platelets 146 (L) 150 - 400 K/uL  Sedimentation rate     Status: Abnormal   Collection Time: 06/27/18  9:13 AM  Result Value Ref Range   Sed Rate 78 (H) 0 - 22 mm/hr  C-reactive protein     Status: Abnormal   Collection Time: 06/27/18  9:13 AM  Result Value Ref Range   CRP 23.7 (H) <1.0 mg/dL  Prepare RBC     Status: None   Collection Time: 06/27/18 12:03 PM  Result Value Ref Range   Order Confirmation      ORDER PROCESSED BY BLOOD  BANK Performed at [facility], [address] [place], [place] [address]   Type and screen     Status: None (Preliminary result)   Collection Time: 06/27/18 12:09 PM  Result Value Ref Range   ABO/RH(D) O POS    Antibody Screen NEG    Sample Expiration 06/30/2018    Unit Number [id number]    Blood Component Type RED CELLS,LR    Unit division 00    Status of Unit ALLOCATED    Transfusion Status OK TO TRANSFUSE    Crossmatch Result Compatible    Unit Number [id number]    Blood Component Type RED CELLS,LR    Unit division 00    Status of Unit ISSUED    Transfusion Status OK TO TRANSFUSE    Crossmatch Result      Compatible Performed at [facility], [address] [place], [place] [address]     Studies/Results: No results found.    Assessment: Colitis. Biopsies pending.  Rule out ulcerative colitis versus Crohn's colitis. Which ever this is the endoscopic picture looks severe  Plan:   Continue current management. Follow H&H.    Gwenevere Abbot 06/27/2018, 5:00 PM  Pager: [phone] If no answer or after 5 PM call [phone]

## 2018-06-27 NOTE — Progress Notes (Signed)
PROGRESS NOTE        PATIENT DETAILS Name: Katrina Oconnor Age: 47 y.o. Sex: female Date of Birth: May 24, 1971 Admit Date: 06/21/2018 Admitting Physician Lorretta Harp, MD [id number], No Pcp Per  Brief Narrative: Patient is a 47 y.o. female with GERD, arthritis-admitted with a 2-day history of abdominal pain and diarrhea.  Underwent colonoscopy on 9/10-with findings of ulcerative colitis.  Significant response after starting steroids and Asacol.  See below for further details.  Subjective: Diarrhea has markedly improved-she had only 2 loose but more formed stools overnight.  No further abdominal pain.  Assessment/Plan: Newly diagnosed ulcerative colitis: Admitted with 2-week history of diarrhea and abdominal pain, stool studies negative, colonoscopy suggestive of ulcerative colitis.  Remarkable response to steroids and anti-inflammatory agents.  Stop all antibiotics.  Spoke with GI MD-we will need to be on prednisone 40 mg daily until seen by gastroenterology in the outpatient setting, continue Asacol.  Anemia: Suspect this is mostly from blood loss in the setting of colitis, acute illness.  Will transfuse 2 units of PRBC today, recheck CBC tomorrow morning.  Hypokalemia: Secondary to GI loss-repleted-follow periodically.  DVT Prophylaxis: SCD's  Code Status: Full code  Family Communication: None at bedside  Disposition Plan: Remain inpatient-home tomorrow morning  Antimicrobial agents: Anti-infectives (From admission, onward)   Start     Dose/Rate Route Frequency Ordered Stop   06/26/18 1230  levofloxacin (LEVAQUIN) IVPB 750 mg  Status:  Discontinued     750 mg 100 mL/hr over 90 Minutes Intravenous Every 24 hours 06/26/18 1123 06/27/18 1006   06/26/18 1200  metroNIDAZOLE (FLAGYL) IVPB 500 mg  Status:  Discontinued     500 mg 100 mL/hr over 60 Minutes Intravenous Every 8 hours 06/26/18 0917 06/27/18 1006      Procedures: 9/10>>  colonoscopy  CONSULTS:  GI  Time spent: 25- minutes-Greater than 50% of this time was spent in counseling, explanation of diagnosis, planning of further management, and coordination of care.  MEDICATIONS: Scheduled Meds: . sodium chloride   Intravenous Once  . acetaminophen  650 mg Oral Once  . cyclobenzaprine  5 mg Oral QHS  . diphenhydrAMINE  25 mg Intravenous Once  . furosemide  20 mg Intravenous Once  . mesalamine  1,000 mg Oral QID  . metoCLOPramide (REGLAN) injection  10 mg Intravenous Q8H  . pantoprazole  40 mg Oral Q1200  . phosphorus  500 mg Oral TID  . [START ON 06/28/2018] predniSONE  40 mg Oral Q breakfast   Continuous Infusions: . sodium chloride Stopped (06/27/18 0507)  . famotidine (PEPCID) IV 20 mg (06/27/18 1058)   PRN Meds:.sodium chloride, acetaminophen, lip balm, ondansetron (ZOFRAN) IV, promethazine, zolpidem   PHYSICAL EXAM: Vital signs: Vitals:   06/26/18 1240 06/26/18 1320 06/26/18 2057 06/27/18 0511  BP:  (!) 114/56 (!) 101/51 110/65  Pulse:  (!) 125 (!) 105 92  Resp:  16 16 16   Temp: 99.8 F (37.7 C) 98.8 F (37.1 C) 98 F (36.7 C) 98 F (36.7 C)  TempSrc: Oral Oral Oral Oral  SpO2:  100% 100% 100%  Weight:      Height:       Filed Weights   06/21/18 1904 06/22/18 0541 06/26/18 0912  Weight: 83.9 kg 85.9 kg 82.6 kg   Body mass index is 38.04 kg/m.   General appearance :Awake, alert, not in  any distress. Speech Clear.  Eyes:, pupils equally reactive to light and accomodation,no scleral icterus. HEENT: Atraumatic and Normocephalic Neck: supple Resp:Good air entry bilaterally, no added sounds  CVS: S1 S2 regular, no murmurs.  GI: Bowel sounds present, Non tender and not distended with no gaurding, rigidity or rebound.No organomegaly Extremities: B/L Lower Ext shows no edema, both legs are warm to touch Neurology:  speech clear,Non focal, sensation is grossly intact. Psychiatric: Normal judgment and insight. Alert and oriented x 3.  Normal mood. Musculoskeletal:No digital cyanosis Skin:No Rash, warm and dry Wounds:N/A  I have personally reviewed following labs and imaging studies  LABORATORY DATA: CBC: Recent Labs  Lab 06/21/18 1913 06/22/18 0538 06/24/18 1315 06/27/18 0913  WBC 10.4 9.1 7.8 8.3  NEUTROABS  --   --  5.2  --   HGB 10.4* 9.8* 9.2* 6.5*  HCT 34.4* 32.7* 31.1* 21.1*  MCV 75.9* 77.1* 77.0* 74.8*  PLT 437* 400 290 146*    Basic Metabolic Panel: Recent Labs  Lab 06/22/18 0538 06/22/18 1327 06/22/18 1733 06/24/18 1315 06/25/18 0041 06/27/18 0913  NA 139 140 138 138 137 140  K 3.0* 3.7 4.1 5.1 3.3* 3.9  CL 105 109 108 106 104 110  CO2 20* 20* 20* 25 21* 22  GLUCOSE 86 96 88 123* 114* 115*  BUN <5* <5* <5* <5* <5* 5*  CREATININE 0.71 0.66 0.62 0.54 0.68 0.65  CALCIUM 8.0* 8.1* 7.8* 8.0* 7.5* 7.9*  MG 1.8 2.2  --   --   --   --   PHOS  --  1.5* 1.7*  1.5* 1.9* 2.1*  --     GFR: Estimated Creatinine Clearance: 78.2 mL/min (by C-G formula based on SCr of 0.65 mg/dL).  Liver Function Tests: Recent Labs  Lab 06/21/18 1913 06/22/18 1327 06/22/18 1733  AST 13*  --   --   ALT 12  --   --   ALKPHOS 114  --   --   BILITOT 1.2  --   --   PROT 7.6  --   --   ALBUMIN 2.4* 2.0* 1.9*   Recent Labs  Lab 06/21/18 1913  LIPASE 22   No results for input(s): AMMONIA in the last 168 hours.  Coagulation Profile: Recent Labs  Lab 06/22/18 0538  INR 1.25    Cardiac Enzymes: Recent Labs  Lab 06/24/18 1315  TROPONINI <0.03    BNP (last 3 results) No results for input(s): PROBNP in the last 8760 hours.  HbA1C: Recent Labs    06/24/18 1315  HGBA1C 5.7*    CBG: No results for input(s): GLUCAP in the last 168 hours.  Lipid Profile: No results for input(s): CHOL, HDL, LDLCALC, TRIG, CHOLHDL, LDLDIRECT in the last 72 hours.  Thyroid Function Tests: Recent Labs    06/25/18 0714  T3FREE 1.5*    Anemia Panel: No results for input(s): VITAMINB12, FOLATE, FERRITIN,  TIBC, IRON, RETICCTPCT in the last 72 hours.  Urine analysis:    Component Value Date/Time   COLORURINE AMBER (A) 06/21/2018 1909   APPEARANCEUR HAZY (A) 06/21/2018 1909   LABSPEC 1.025 06/21/2018 1909   PHURINE 5.0 06/21/2018 1909   GLUCOSEU NEGATIVE 06/21/2018 1909   HGBUR MODERATE (A) 06/21/2018 1909   BILIRUBINUR MODERATE (A) 06/21/2018 1909   KETONESUR 80 (A) 06/21/2018 1909   PROTEINUR 100 (A) 06/21/2018 1909   UROBILINOGEN 0.2 06/14/2018 2023   NITRITE NEGATIVE 06/21/2018 1909   LEUKOCYTESUR NEGATIVE 06/21/2018 1909    Sepsis Labs: Lactic  Acid, Venous    Component Value Date/Time   LATICACIDVEN 1.1 06/25/2018 0028    MICROBIOLOGY: Recent Results (from the past 240 hour(s))  C difficile quick scan w PCR reflex     Status: None   Collection Time: 06/22/18  4:48 AM  Result Value Ref Range Status   C Diff antigen NEGATIVE NEGATIVE Final   C Diff toxin NEGATIVE NEGATIVE Final   C Diff interpretation No C. difficile detected.  Final    Comment: Performed at [facility], [address] [place], [place] [address]  Gastrointestinal Panel by PCR , Stool     Status: None   Collection Time: 06/22/18  4:49 AM  Result Value Ref Range Status   Campylobacter species NOT DETECTED NOT DETECTED Final   Plesimonas shigelloides NOT DETECTED NOT DETECTED Final   Salmonella species NOT DETECTED NOT DETECTED Final   Yersinia enterocolitica NOT DETECTED NOT DETECTED Final   Vibrio species NOT DETECTED NOT DETECTED Final   Vibrio cholerae NOT DETECTED NOT DETECTED Final   Enteroaggregative E coli (EAEC) NOT DETECTED NOT DETECTED Final   Enteropathogenic E coli (EPEC) NOT DETECTED NOT DETECTED Final   Enterotoxigenic E coli (ETEC) NOT DETECTED NOT DETECTED Final   Shiga like toxin producing E coli (STEC) NOT DETECTED NOT DETECTED Final   Shigella/Enteroinvasive E coli (EIEC) NOT DETECTED NOT DETECTED Final   Cryptosporidium NOT DETECTED NOT DETECTED Final   Cyclospora  cayetanensis NOT DETECTED NOT DETECTED Final   Entamoeba histolytica NOT DETECTED NOT DETECTED Final   Giardia lamblia NOT DETECTED NOT DETECTED Final   Adenovirus F40/41 NOT DETECTED NOT DETECTED Final   Astrovirus NOT DETECTED NOT DETECTED Final   Norovirus GI/GII NOT DETECTED NOT DETECTED Final   Rotavirus A NOT DETECTED NOT DETECTED Final   Sapovirus (I, II, IV, and V) NOT DETECTED NOT DETECTED Final    Comment: Performed at [facility], [address] [place], [place] [address]  OVA + PARASITE EXAM     Status: None   Collection Time: 06/22/18  4:49 AM  Result Value Ref Range Status   OVA + PARASITE EXAM Final report  Final    Comment: (NOTE) These results were obtained using wet preparation(s) and trichrome stained smear. This test does not include testing for Cryptosporidium parvum, Cyclospora, or Microsporidia. Performed At: [facility] [address] [place], [place] [id number] Jolene Schimke MD ZD:[id number]    Source of Sample STOOL  Final    Comment: Performed at [facility], [address] [place], [place] [address]  Culture, blood (routine x 2)     Status: None (Preliminary result)   Collection Time: 06/25/18  8:29 AM  Result Value Ref Range Status   Specimen Description BLOOD SITE NOT SPECIFIED  Final   Special Requests   Final    BOTTLES DRAWN AEROBIC ONLY Blood Culture adequate volume   Culture   Final    NO GROWTH 2 DAYS Performed at [facility], [address], [place], [place] [address]    Report Status PENDING  Incomplete  Culture, blood (routine x 2)     Status: None (Preliminary result)   Collection Time: 06/25/18  8:29 AM  Result Value Ref Range Status   Specimen Description BLOOD SITE NOT SPECIFIED  Final   Special Requests   Final    BOTTLES DRAWN AEROBIC ONLY Blood Culture results may not be optimal due to an inadequate volume of blood received in culture bottles  Culture   Final    NO GROWTH 2 DAYS Performed at  [facility], [address], [place], [place] [address]    Report Status PENDING  Incomplete    RADIOLOGY STUDIES/RESULTS: Ct Angio Chest Pe W And/or Wo Contrast  Result Date: 06/22/2018 CLINICAL DATA:  Tachycardia with right shoulder and chest pain. EXAM: CT ANGIOGRAPHY CHEST WITH CONTRAST TECHNIQUE: Multidetector CT imaging of the chest was performed using the standard protocol during bolus administration of intravenous contrast. Multiplanar CT image reconstructions and MIPs were obtained to evaluate the vascular anatomy. CONTRAST:  ISOVUE-370 IOPAMIDOL (ISOVUE-370) INJECTION 76% COMPARISON:  None. FINDINGS: Cardiovascular: Motion artifact limits examination. Good opacification of the central and segmental pulmonary arteries. No focal filling defects. No evidence of significant pulmonary embolus. Normal heart size. No pericardial effusion. Normal caliber thoracic aorta. No dissection. Great vessel origins are patent. Mild aortic calcification. Mediastinum/Nodes: Esophagus is decompressed. No significant lymphadenopathy in the chest. Thyroid gland is unremarkable. Lungs/Pleura: Motion artifact limits evaluation. No airspace disease or consolidation is appreciated. No pleural effusions. No pneumothorax. Airways are patent. Upper Abdomen: No acute abnormality. Musculoskeletal: No chest wall abnormality. No acute or significant osseous findings. Review of the MIP images confirms the above findings. IMPRESSION: No evidence of significant pulmonary embolus. No evidence of active pulmonary disease. Electronically Signed   By: Burman Nieves M.D.   On: 06/22/2018 01:27   US Abdomen Limited  Result Date: 06/21/2018 CLINICAL DATA:  Initial evaluation for acute abdominal pain with right-sided chest pain, nausea, vomiting. EXAM: ULTRASOUND ABDOMEN LIMITED RIGHT UPPER QUADRANT COMPARISON:  None. FINDINGS: Gallbladder: No gallstones or wall thickening visualized. No sonographic Murphy sign noted by  sonographer. Common bile duct: Diameter: 3.4 mm Liver: No focal lesion identified. Within normal limits in parenchymal echogenicity. Portal vein is patent on color Doppler imaging with normal direction of blood flow towards the liver. IMPRESSION: Normal right upper quadrant ultrasound. Electronically Signed   By: Rise Mu M.D.   On: 06/21/2018 23:41   Dg Abd Acute W/chest  Result Date: 06/21/2018 CLINICAL DATA:  Initial evaluation for acute abdominal pain, right chest pain, nausea, vomiting. EXAM: DG ABDOMEN ACUTE W/ 1V CHEST COMPARISON:  Prior radiograph from 12/11/2013. FINDINGS: Cardiac and mediastinal silhouettes within normal limits. Mild aortic atherosclerosis. Lungs normally inflated. Minimal bibasilar subsegmental atelectatic changes. No focal infiltrates. No pulmonary edema or pleural effusion. No pneumothorax. Bowel gas pattern within normal limits without obstruction or ileus. No abnormal bowel wall thickening. No free air. No soft tissue mass or abnormal calcification. Visualized osseous structures within normal limits. IMPRESSION: 1. Nonobstructive bowel gas pattern with no radiographic evidence for acute intra-abdominal process. 2. Mild bibasilar subsegmental atelectasis. No other active cardiopulmonary disease. 3. Mild aortic atherosclerosis. Electronically Signed   By: Rise Mu M.D.   On: 06/21/2018 23:53     LOS: 4 days   Jeoffrey Massed, MD  [facility]  If 7PM-7AM, please contact night-coverage  Please page via [email or web address]-click on MD name and type text message  06/27/2018, 11:21 AM

## 2018-06-27 NOTE — Progress Notes (Signed)
Nutrition Follow-up  DOCUMENTATION CODES:   Obesity unspecified  INTERVENTION:  Provide Boost Breeze po BID, each supplement provides 250 kcal and 9 grams of protein.  Encourage adequate PO intake.   NUTRITION DIAGNOSIS:   Inadequate oral intake related to nausea, vomiting as evidenced by per patient/family report; improving  GOAL:   Patient will meet greater than or equal to 90% of their needs; progressing  MONITOR:   PO intake, Labs, Skin, Supplement acceptance, Weight trends, I & O's  REASON FOR ASSESSMENT:   Malnutrition Screening Tool    ASSESSMENT:   47 year old female who presented to the ED with nausea, vomiting, and diarrhea for 3 weeks. PMH significant for GERD, sciatica, and anemia.  Pt underwent colonoscopy yesterday. Pt with newly diagnosed ulcerative colitis. Steroids and asacol has been started. Meal completion has been 100%. Pt reports her diarrhea has improved and reports no abdominal pains during time of visit. Pt is agreeable to nutritional supplements to aid in caloric and protein needs. Pt requests a juice supplement rather than a milk based supplement. RD to order [facility]. Labs and medications reviewed.   Diet Order:   Diet Order            Diet Heart Room service appropriate? Yes; Fluid consistency: Thin  Diet effective now              EDUCATION NEEDS:   No education needs have been identified at this time  Skin:  Skin Assessment: Reviewed RN Assessment  Last BM:  9/10  Height:   Ht Readings from Last 1 Encounters:  06/26/18 4\' 10"  (1.473 m)    Weight:   Wt Readings from Last 1 Encounters:  06/26/18 82.6 kg    Ideal Body Weight:  40.91 kg  BMI:  Body mass index is 38.04 kg/m.  Estimated Nutritional Needs:   Kcal:  1600-1800  Protein:  85-100 grams  Fluid:  1.6-1.8 L    Roslyn Smiling, MS, RD, LDN Pager # [phone] After hours/ weekend pager # [phone]

## 2018-06-28 LAB — HEMOGLOBIN AND HEMATOCRIT, BLOOD
HEMATOCRIT: 24.6 % — AB (ref 36.0–46.0)
HEMATOCRIT: 33.1 % — AB (ref 36.0–46.0)
HEMOGLOBIN: 7.5 g/dL — AB (ref 12.0–15.0)
Hemoglobin: 10.4 g/dL — ABNORMAL LOW (ref 12.0–15.0)

## 2018-06-28 LAB — PREPARE RBC (CROSSMATCH)

## 2018-06-28 MED ORDER — PREDNISONE 20 MG PO TABS: 40.0000 mg | ORAL_TABLET | Freq: Every day | ORAL | 0 refills | Status: DC

## 2018-06-28 MED ORDER — SODIUM CHLORIDE 0.9% IV SOLUTION
Freq: Once | INTRAVENOUS | Status: AC
  Administered 2018-06-28: 11:00:00 via INTRAVENOUS

## 2018-06-28 MED ORDER — PANTOPRAZOLE SODIUM 40 MG PO TBEC: 40.0000 mg | DELAYED_RELEASE_TABLET | Freq: Every day | ORAL | 0 refills | Status: DC

## 2018-06-28 MED ORDER — MESALAMINE ER 250 MG PO CPCR: 1000.0000 mg | ORAL_CAPSULE | Freq: Four times a day (QID) | ORAL | 0 refills | Status: DC

## 2018-06-28 MED ORDER — ACETAMINOPHEN 325 MG PO TABS: 650.0000 mg | ORAL_TABLET | Freq: Once | ORAL | Status: DC

## 2018-06-28 MED ORDER — DIPHENHYDRAMINE HCL 50 MG/ML IJ SOLN: 25.0000 mg | Freq: Once | INTRAMUSCULAR | Status: DC

## 2018-06-28 MED ORDER — FUROSEMIDE 10 MG/ML IJ SOLN: 20.0000 mg | Freq: Once | INTRAMUSCULAR | Status: DC

## 2018-06-28 NOTE — Care Management Note (Signed)
Case Management Note  Patient Details  Name: Katrina Oconnor MRN: [id number] Date of Birth: 12-11-1970  Subjective/Objective:      Admitted with Colitis. From home with husband. Independent with ADL's , no DME usage.          Action/Plan: Transition to home. Pt without PCP, NCM instructed pt to refer to back of insurance card and call [phone] to assist with finding in network PCP. Pt stated interested in Dr. Dorothyann Pengobyn Sanders. NCM provided pt with Dr.Sanders office #.  Expected Discharge Date:  06/28/18               Expected Discharge Plan:  Home/Self Care  In-House Referral:  PCP / [facility]  Discharge planning Services  CM Consult  Post Acute Care Choice:    Choice offered to:     DME Arranged:  N/A DME Agency:  NA  HH Arranged:  NA HH Agency:  NA  Status of Service:  Completed, signed off  If discussed at Long Length of Stay Meetings, dates discussed:    Additional Comments:  Epifanio LeschesCole, Avalin Briley Hudson, RN 06/28/2018, 10:39 AM

## 2018-06-28 NOTE — Progress Notes (Signed)
[facility] Progress Note  Subjective: The patient feels better today. It seems that the steroids are helping her ulcerative colitis symptoms. Biopsies were checked and show severely active colitis consistent with ulcerative colitis.  Objective: Vital signs in last 24 hours: Temp:  [97.5 F (36.4 C)-98.2 F (36.8 C)] 97.5 F (36.4 C) (09/12 0025) Pulse Rate:  [73-101] 80 (09/12 0703) Resp:  [18-19] 19 (09/12 0703) BP: (104-132)/(60-82) 126/76 (09/12 0703) SpO2:  [96 %-100 %] 100 % (09/12 0703) Weight change:    PE:  No distress  Abdomen nontender  Lab Results: Results for orders placed or performed during the hospital encounter of 06/21/18 (from the past 24 hour(s))  Basic metabolic panel     Status: Abnormal   Collection Time: 06/27/18  9:13 AM  Result Value Ref Range   Sodium 140 135 - 145 mmol/L   Potassium 3.9 3.5 - 5.1 mmol/L   Chloride 110 98 - 111 mmol/L   CO2 22 22 - 32 mmol/L   Glucose, Bld 115 (H) 70 - 99 mg/dL   BUN 5 (L) 6 - 20 mg/dL   Creatinine, Ser 1.610.65 0.44 - 1.00 mg/dL   Calcium 7.9 (L) 8.9 - 10.3 mg/dL   GFR calc non Af Amer >60 >60 mL/min   GFR calc Af Amer >60 >60 mL/min   Anion gap 8 5 - 15  CBC     Status: Abnormal   Collection Time: 06/27/18  9:13 AM  Result Value Ref Range   WBC 8.3 4.0 - 10.5 K/uL   RBC 2.82 (L) 3.87 - 5.11 MIL/uL   Hemoglobin 6.5 (LL) 12.0 - 15.0 g/dL   HCT 09.621.1 (L) 04.536.0 - 40.946.0 %   MCV 74.8 (L) 78.0 - 100.0 fL   MCH 23.0 (L) 26.0 - 34.0 pg   MCHC 30.8 30.0 - 36.0 g/dL   RDW 81.116.2 (H) 91.411.5 - 78.215.5 %   Platelets 146 (L) 150 - 400 K/uL  Sedimentation rate     Status: Abnormal   Collection Time: 06/27/18  9:13 AM  Result Value Ref Range   Sed Rate 78 (H) 0 - 22 mm/hr  C-reactive protein     Status: Abnormal   Collection Time: 06/27/18  9:13 AM  Result Value Ref Range   CRP 23.7 (H) <1.0 mg/dL  Prepare RBC     Status: None   Collection Time: 06/27/18 12:03 PM  Result Value Ref Range   Order Confirmation      ORDER PROCESSED BY BLOOD BANK Performed at [facility], [address] [place], [place] [address]   Type and screen     Status: None   Collection Time: 06/27/18 12:09 PM  Result Value Ref Range   ABO/RH(D) O POS    Antibody Screen NEG    Sample Expiration 06/30/2018    Unit Number [id number]    Blood Component Type RED CELLS,LR    Unit division 00    Status of Unit ISSUED,FINAL    Transfusion Status OK TO TRANSFUSE    Crossmatch Result Compatible    Unit Number [id number]    Blood Component Type RED CELLS,LR    Unit division 00    Status of Unit ISSUED,FINAL    Transfusion Status OK TO TRANSFUSE    Crossmatch Result      Compatible Performed at [facility], [address] [place], [place] [address]   Hemoglobin and hematocrit, blood     Status: Abnormal   Collection Time:  06/28/18  4:40 AM  Result Value Ref Range   Hemoglobin 7.5 (L) 12.0 - 15.0 g/dL   HCT 40.9 (L) 81.1 - 91.4 %    Studies/Results: No results found.    Assessment: Ulcerative colitis  Plan:   The patient only received one unit of blood yesterday because of a problem with her IV. She will get the other unit today. Hopefully she can go home. I would like to see her back in the office in a couple of weeks. She will need to go home on mesalamine as well as prednisone. We will sign off at this point. Call us if needed.    SAM F Yelena Metzer 06/28/2018, 9:01 AM  Pager: [phone] If no answer or after 5 PM call [phone]

## 2018-06-28 NOTE — Plan of Care (Signed)
POC reviewed with patient, no acute events throughout the night. 1 unit of PRBC transfused as ordered, HGB currently 7.5. PRN medications considered, but not needed throughout shift. Fall prevention protocol maintained, no fall episodes noted. RN to report off to on coming Rn.

## 2018-06-28 NOTE — Progress Notes (Signed)
Discharge instructions given. Pt verbalized understanding and all questions were answered.  

## 2018-06-28 NOTE — Progress Notes (Signed)
She is doing much better-abdominal pain has essentially resolved, diarrhea has slowed down significantly.  She was due to receive 2 units of PRBC yesterday-she only received 1 unit-she had issues with IV infiltration with the second unit.  Hemoglobin still low, will receive 1 additional unit-and if the recheck CBC is stable she will be discharged home later today.

## 2018-06-28 NOTE — Discharge Summary (Signed)
PATIENT DETAILS Name: Katrina Oconnor Age: 47 y.o. Sex: female Date of Birth: 1971/03/07 MRN: [id number]. Admitting Physician: Lorretta Harp, MD WUJ:[id number], No Pcp Per  Admit Date: 06/21/2018 Discharge date: 06/28/2018  Recommendations for Outpatient Follow-up:  1. Follow up with PCP in 1-2 weeks 2. Please obtain BMP/CBC in one week 3. Please ensure follow-up with GI MD.  Admitted From:  Home   Disposition: Home   Home Health: No  Equipment/Devices: None  Discharge Condition: Stable  CODE STATUS: FULL CODE  Diet recommendation:  Regular  Brief Summary: See H&P, Labs, Consult and Test reports for all details in brief, Patient is a 47 y.o. female with GERD, arthritis-admitted with a 2-day history of abdominal pain and diarrhea.  Underwent colonoscopy on 9/10-with findings of ulcerative colitis.  Significant response after starting steroids and Asacol.  See below for further details.  Brief Hospital Course: Newly diagnosed ulcerative colitis: Admitted with 2-week history of diarrhea and abdominal pain, stool studies negative, colonoscopy suggestive of ulcerative colitis.  Biopsy also consistent with ulcerative colitis. Remarkable response to steroids and anti-inflammatory agents.  Spoke with GI MD-we will need to be on prednisone 40 mg daily until seen by gastroenterology in the outpatient setting, continue Asacol.  Anemia: Suspect this is mostly from blood loss in the setting of colitis, acute illness.    She was supposed to be transfused 2 units of PRBC yesterday, however due to IV line issues she only received 1 unit-plans are to transfuse 1 additional unit today-and if posttransfusion CBC stable-she will be discharged home with close outpatient follow-up with GI and PCP.    Hypokalemia: Secondary to GI loss-repleted-follow periodically.  Procedures/Studies: 9/10>> colonoscopy  Discharge Diagnoses:  Principal Problem:   Colitis Active Problems:   Nausea vomiting and  diarrhea   Sinus tachycardia   Discharge Instructions:  Activity:  As tolerated   Discharge Instructions    Diet - low sodium heart healthy   Complete by:  As directed    Discharge instructions   Complete by:  As directed    Follow with Primary MD  Patient in 1 week  Follow with GI MD-Dr. Evette Cristal in the next 1-2 weeks.  Please get a complete blood count and chemistry panel checked by your Primary MD at your next visit, and again as instructed by your Primary MD.  Get Medicines reviewed and adjusted: Please take all your medications with you for your next visit with your Primary MD  Laboratory/radiological data: Please request your Primary MD to go over all hospital tests and procedure/radiological results at the follow up, please ask your Primary MD to get all Hospital records sent to his/her office.  In some cases, they will be blood work, cultures and biopsy results pending at the time of your discharge. Please request that your primary care M.D. follows up on these results.  Also Note the following: If you experience worsening of your admission symptoms, develop shortness of breath, life threatening emergency, suicidal or homicidal thoughts you must seek medical attention immediately by calling 911 or calling your MD immediately  if symptoms less severe.  You must read complete instructions/literature along with all the possible adverse reactions/side effects for all the Medicines you take and that have been prescribed to you. Take any new Medicines after you have completely understood and accpet all the possible adverse reactions/side effects.   Do not drive when taking Pain medications or sleeping medications (Benzodaizepines)  Do not take more than prescribed Pain, Sleep and  Anxiety Medications. It is not advisable to combine anxiety,sleep and pain medications without talking with your primary care practitioner  Special Instructions: If you have smoked or chewed Tobacco  in  the last 2 yrs please stop smoking, stop any regular Alcohol  and or any Recreational drug use.  Wear Seat belts while driving.  Please note: You were cared for by a hospitalist during your hospital stay. Once you are discharged, your primary care physician will handle any further medical issues. Please note that NO REFILLS for any discharge medications will be authorized once you are discharged, as it is imperative that you return to your primary care physician (or establish a relationship with a primary care physician if you do not have one) for your post hospital discharge needs so that they can reassess your need for medications and monitor your lab values.   Increase activity slowly   Complete by:  As directed      Allergies as of 06/28/2018      Reactions   Penicillins Hives      Medication List    STOP taking these medications   loperamide 2 MG capsule Commonly known as:  IMODIUM     TAKE these medications   cyclobenzaprine 5 MG tablet Commonly known as:  FLEXERIL Take 1 tablet (5 mg total) by mouth at bedtime.   mesalamine 250 MG CR capsule Commonly known as:  PENTASA Take 4 capsules (1,000 mg total) by mouth 4 (four) times daily.   ondansetron 4 MG tablet Commonly known as:  ZOFRAN Take 1 tablet (4 mg total) by mouth every 8 (eight) hours as needed for nausea or vomiting.   pantoprazole 40 MG tablet Commonly known as:  PROTONIX Take 1 tablet (40 mg total) by mouth daily at 12 noon.   predniSONE 20 MG tablet Commonly known as:  DELTASONE Take 2 tablets (40 mg total) by mouth daily with breakfast. Start taking on:  06/29/2018      Follow-up Information    Graylin Shiver, MD. Schedule an appointment as soon as possible for a visit in 1 week(s).   Specialty:  Gastroenterology Contact information: [address] [place] [address] [phone]        Primary care MD. Schedule an appointment as soon as possible for a visit in 1 week(s).           Allergies  Allergen Reactions  . Penicillins Hives    Consultations:   GI  Other Procedures/Studies: Ct Angio Chest Pe W And/or Wo Contrast  Result Date: 06/22/2018 CLINICAL DATA:  Tachycardia with right shoulder and chest pain. EXAM: CT ANGIOGRAPHY CHEST WITH CONTRAST TECHNIQUE: Multidetector CT imaging of the chest was performed using the standard protocol during bolus administration of intravenous contrast. Multiplanar CT image reconstructions and MIPs were obtained to evaluate the vascular anatomy. CONTRAST:  ISOVUE-370 IOPAMIDOL (ISOVUE-370) INJECTION 76% COMPARISON:  None. FINDINGS: Cardiovascular: Motion artifact limits examination. Good opacification of the central and segmental pulmonary arteries. No focal filling defects. No evidence of significant pulmonary embolus. Normal heart size. No pericardial effusion. Normal caliber thoracic aorta. No dissection. Great vessel origins are patent. Mild aortic calcification. Mediastinum/Nodes: Esophagus is decompressed. No significant lymphadenopathy in the chest. Thyroid gland is unremarkable. Lungs/Pleura: Motion artifact limits evaluation. No airspace disease or consolidation is appreciated. No pleural effusions. No pneumothorax. Airways are patent. Upper Abdomen: No acute abnormality. Musculoskeletal: No chest wall abnormality. No acute or significant osseous findings. Review of the MIP images  confirms the above findings. IMPRESSION: No evidence of significant pulmonary embolus. No evidence of active pulmonary disease. Electronically Signed   By: Burman Nieves M.D.   On: 06/22/2018 01:27   US Abdomen Limited  Result Date: 06/21/2018 CLINICAL DATA:  Initial evaluation for acute abdominal pain with right-sided chest pain, nausea, vomiting. EXAM: ULTRASOUND ABDOMEN LIMITED RIGHT UPPER QUADRANT COMPARISON:  None. FINDINGS: Gallbladder: No gallstones or wall thickening visualized. No sonographic Murphy sign noted by sonographer. Common  bile duct: Diameter: 3.4 mm Liver: No focal lesion identified. Within normal limits in parenchymal echogenicity. Portal vein is patent on color Doppler imaging with normal direction of blood flow towards the liver. IMPRESSION: Normal right upper quadrant ultrasound. Electronically Signed   By: Rise Mu M.D.   On: 06/21/2018 23:41   Dg Abd Acute W/chest  Result Date: 06/21/2018 CLINICAL DATA:  Initial evaluation for acute abdominal pain, right chest pain, nausea, vomiting. EXAM: DG ABDOMEN ACUTE W/ 1V CHEST COMPARISON:  Prior radiograph from 12/11/2013. FINDINGS: Cardiac and mediastinal silhouettes within normal limits. Mild aortic atherosclerosis. Lungs normally inflated. Minimal bibasilar subsegmental atelectatic changes. No focal infiltrates. No pulmonary edema or pleural effusion. No pneumothorax. Bowel gas pattern within normal limits without obstruction or ileus. No abnormal bowel wall thickening. No free air. No soft tissue mass or abnormal calcification. Visualized osseous structures within normal limits. IMPRESSION: 1. Nonobstructive bowel gas pattern with no radiographic evidence for acute intra-abdominal process. 2. Mild bibasilar subsegmental atelectasis. No other active cardiopulmonary disease. 3. Mild aortic atherosclerosis. Electronically Signed   By: Rise Mu M.D.   On: 06/21/2018 23:53     TODAY-DAY OF DISCHARGE:  Subjective:   Katrina Oconnor today has no headache,no chest abdominal pain,no new weakness tingling or numbness, feels much better wants to go home today.  Objective:   Blood pressure 113/74, pulse 89, temperature 98.5 F (36.9 C), temperature source Oral, resp. rate 18, height 4\' 10"  (1.473 m), weight 82.6 kg, last menstrual period 06/09/2018, SpO2 99 %.  Intake/Output Summary (Last 24 hours) at 06/28/2018 1034 Last data filed at 06/28/2018 0019 Gross per 24 hour  Intake 1297 ml  Output -  Net 1297 ml   Filed Weights   06/21/18 1904 06/22/18 0541  06/26/18 0912  Weight: 83.9 kg 85.9 kg 82.6 kg    Exam: Awake Alert, Oriented *3, No new F.N deficits, Normal affect [place].AT,PERRAL Supple Neck,No JVD, No cervical lymphadenopathy appriciated.  Symmetrical Chest wall movement, Good air movement bilaterally, CTAB RRR,No Gallops,Rubs or new Murmurs, No Parasternal Heave +ve B.Sounds, Abd Soft, Non tender, No organomegaly appriciated, No rebound -guarding or rigidity. No Cyanosis, Clubbing or edema, No new Rash or bruise   PERTINENT RADIOLOGIC STUDIES: Ct Angio Chest Pe W And/or Wo Contrast  Result Date: 06/22/2018 CLINICAL DATA:  Tachycardia with right shoulder and chest pain. EXAM: CT ANGIOGRAPHY CHEST WITH CONTRAST TECHNIQUE: Multidetector CT imaging of the chest was performed using the standard protocol during bolus administration of intravenous contrast. Multiplanar CT image reconstructions and MIPs were obtained to evaluate the vascular anatomy. CONTRAST:  ISOVUE-370 IOPAMIDOL (ISOVUE-370) INJECTION 76% COMPARISON:  None. FINDINGS: Cardiovascular: Motion artifact limits examination. Good opacification of the central and segmental pulmonary arteries. No focal filling defects. No evidence of significant pulmonary embolus. Normal heart size. No pericardial effusion. Normal caliber thoracic aorta. No dissection. Great vessel origins are patent. Mild aortic calcification. Mediastinum/Nodes: Esophagus is decompressed. No significant lymphadenopathy in the chest. Thyroid gland is unremarkable. Lungs/Pleura: Motion artifact limits evaluation. No airspace  disease or consolidation is appreciated. No pleural effusions. No pneumothorax. Airways are patent. Upper Abdomen: No acute abnormality. Musculoskeletal: No chest wall abnormality. No acute or significant osseous findings. Review of the MIP images confirms the above findings. IMPRESSION: No evidence of significant pulmonary embolus. No evidence of active pulmonary disease. Electronically Signed   By:  Burman Nieves M.D.   On: 06/22/2018 01:27   US Abdomen Limited  Result Date: 06/21/2018 CLINICAL DATA:  Initial evaluation for acute abdominal pain with right-sided chest pain, nausea, vomiting. EXAM: ULTRASOUND ABDOMEN LIMITED RIGHT UPPER QUADRANT COMPARISON:  None. FINDINGS: Gallbladder: No gallstones or wall thickening visualized. No sonographic Murphy sign noted by sonographer. Common bile duct: Diameter: 3.4 mm Liver: No focal lesion identified. Within normal limits in parenchymal echogenicity. Portal vein is patent on color Doppler imaging with normal direction of blood flow towards the liver. IMPRESSION: Normal right upper quadrant ultrasound. Electronically Signed   By: Rise Mu M.D.   On: 06/21/2018 23:41   Dg Abd Acute W/chest  Result Date: 06/21/2018 CLINICAL DATA:  Initial evaluation for acute abdominal pain, right chest pain, nausea, vomiting. EXAM: DG ABDOMEN ACUTE W/ 1V CHEST COMPARISON:  Prior radiograph from 12/11/2013. FINDINGS: Cardiac and mediastinal silhouettes within normal limits. Mild aortic atherosclerosis. Lungs normally inflated. Minimal bibasilar subsegmental atelectatic changes. No focal infiltrates. No pulmonary edema or pleural effusion. No pneumothorax. Bowel gas pattern within normal limits without obstruction or ileus. No abnormal bowel wall thickening. No free air. No soft tissue mass or abnormal calcification. Visualized osseous structures within normal limits. IMPRESSION: 1. Nonobstructive bowel gas pattern with no radiographic evidence for acute intra-abdominal process. 2. Mild bibasilar subsegmental atelectasis. No other active cardiopulmonary disease. 3. Mild aortic atherosclerosis. Electronically Signed   By: Rise Mu M.D.   On: 06/21/2018 23:53     PERTINENT LAB RESULTS: CBC: Recent Labs    06/27/18 0913 06/28/18 0440  WBC 8.3  --   HGB 6.5* 7.5*  HCT 21.1* 24.6*  PLT 146*  --    CMET CMP     Component Value Date/Time   NA  140 06/27/2018 0913   K 3.9 06/27/2018 0913   CL 110 06/27/2018 0913   CO2 22 06/27/2018 0913   GLUCOSE 115 (H) 06/27/2018 0913   BUN 5 (L) 06/27/2018 0913   CREATININE 0.65 06/27/2018 0913   CALCIUM 7.9 (L) 06/27/2018 0913   PROT 7.6 06/21/2018 1913   ALBUMIN 1.9 (L) 06/22/2018 1733   AST 13 (L) 06/21/2018 1913   ALT 12 06/21/2018 1913   ALKPHOS 114 06/21/2018 1913   BILITOT 1.2 06/21/2018 1913   GFRNONAA >60 06/27/2018 0913   GFRAA >60 06/27/2018 0913    GFR Estimated Creatinine Clearance: 78.2 mL/min (by C-G formula based on SCr of 0.65 mg/dL). No results for input(s): LIPASE, AMYLASE in the last 72 hours. No results for input(s): CKTOTAL, CKMB, CKMBINDEX, TROPONINI in the last 72 hours. Invalid input(s): POCBNP No results for input(s): DDIMER in the last 72 hours. No results for input(s): HGBA1C in the last 72 hours. No results for input(s): CHOL, HDL, LDLCALC, TRIG, CHOLHDL, LDLDIRECT in the last 72 hours. No results for input(s): TSH, T4TOTAL, T3FREE, THYROIDAB in the last 72 hours.  Invalid input(s): FREET3 No results for input(s): VITAMINB12, FOLATE, FERRITIN, TIBC, IRON, RETICCTPCT in the last 72 hours. Coags: No results for input(s): INR in the last 72 hours.  Invalid input(s): PT Microbiology: Recent Results (from the past 240 hour(s))  C difficile quick scan w  PCR reflex     Status: None   Collection Time: 06/22/18  4:48 AM  Result Value Ref Range Status   C Diff antigen NEGATIVE NEGATIVE Final   C Diff toxin NEGATIVE NEGATIVE Final   C Diff interpretation No C. difficile detected.  Final    Comment: Performed at [facility], [address], [place], [place] [address]  Gastrointestinal Panel by PCR , Stool     Status: None   Collection Time: 06/22/18  4:49 AM  Result Value Ref Range Status   Campylobacter species NOT DETECTED NOT DETECTED Final   Plesimonas shigelloides NOT DETECTED NOT DETECTED Final   Salmonella species NOT DETECTED NOT DETECTED  Final   Yersinia enterocolitica NOT DETECTED NOT DETECTED Final   Vibrio species NOT DETECTED NOT DETECTED Final   Vibrio cholerae NOT DETECTED NOT DETECTED Final   Enteroaggregative E coli (EAEC) NOT DETECTED NOT DETECTED Final   Enteropathogenic E coli (EPEC) NOT DETECTED NOT DETECTED Final   Enterotoxigenic E coli (ETEC) NOT DETECTED NOT DETECTED Final   Shiga like toxin producing E coli (STEC) NOT DETECTED NOT DETECTED Final   Shigella/Enteroinvasive E coli (EIEC) NOT DETECTED NOT DETECTED Final   Cryptosporidium NOT DETECTED NOT DETECTED Final   Cyclospora cayetanensis NOT DETECTED NOT DETECTED Final   Entamoeba histolytica NOT DETECTED NOT DETECTED Final   Giardia lamblia NOT DETECTED NOT DETECTED Final   Adenovirus F40/41 NOT DETECTED NOT DETECTED Final   Astrovirus NOT DETECTED NOT DETECTED Final   Norovirus GI/GII NOT DETECTED NOT DETECTED Final   Rotavirus A NOT DETECTED NOT DETECTED Final   Sapovirus (I, II, IV, and V) NOT DETECTED NOT DETECTED Final    Comment: Performed at [facility], [address] [place], [place] [address]  OVA + PARASITE EXAM     Status: None   Collection Time: 06/22/18  4:49 AM  Result Value Ref Range Status   OVA + PARASITE EXAM Final report  Final    Comment: (NOTE) These results were obtained using wet preparation(s) and trichrome stained smear. This test does not include testing for Cryptosporidium parvum, Cyclospora, or Microsporidia. Performed At: [facility] [address] [place], [place] [id number] Jolene Schimke MD WG:[id number]    Source of Sample STOOL  Final    Comment: Performed at [facility], [address] [place], [place] [address]  Culture, blood (routine x 2)     Status: None (Preliminary result)   Collection Time: 06/25/18  8:29 AM  Result Value Ref Range Status   Specimen Description BLOOD SITE NOT SPECIFIED  Final   Special Requests   Final    BOTTLES DRAWN AEROBIC ONLY Blood Culture adequate  volume   Culture   Final    NO GROWTH 3 DAYS Performed at [facility], [address], [place], [place] [address]    Report Status PENDING  Incomplete  Culture, blood (routine x 2)     Status: None (Preliminary result)   Collection Time: 06/25/18  8:29 AM  Result Value Ref Range Status   Specimen Description BLOOD SITE NOT SPECIFIED  Final   Special Requests   Final    BOTTLES DRAWN AEROBIC ONLY Blood Culture results may not be optimal due to an inadequate volume of blood received in culture bottles   Culture   Final    NO GROWTH 3 DAYS Performed at [facility], [address], [place], [place] [address]    Report  Status PENDING  Incomplete    FURTHER DISCHARGE INSTRUCTIONS:  Get Medicines reviewed and adjusted: Please take all your medications with you for your next visit with your Primary MD  Laboratory/radiological data: Please request your Primary MD to go over all hospital tests and procedure/radiological results at the follow up, please ask your Primary MD to get all Hospital records sent to his/her office.  In some cases, they will be blood work, cultures and biopsy results pending at the time of your discharge. Please request that your primary care M.D. goes through all the records of your hospital data and follows up on these results.  Also Note the following: If you experience worsening of your admission symptoms, develop shortness of breath, life threatening emergency, suicidal or homicidal thoughts you must seek medical attention immediately by calling 911 or calling your MD immediately  if symptoms less severe.  You must read complete instructions/literature along with all the possible adverse reactions/side effects for all the Medicines you take and that have been prescribed to you. Take any new Medicines after you have completely understood and accpet all the possible adverse reactions/side effects.   Do not drive when taking Pain medications or sleeping  medications (Benzodaizepines)  Do not take more than prescribed Pain, Sleep and Anxiety Medications. It is not advisable to combine anxiety,sleep and pain medications without talking with your primary care practitioner  Special Instructions: If you have smoked or chewed Tobacco  in the last 2 yrs please stop smoking, stop any regular Alcohol  and or any Recreational drug use.  Wear Seat belts while driving.  Please note: You were cared for by a hospitalist during your hospital stay. Once you are discharged, your primary care physician will handle any further medical issues. Please note that NO REFILLS for any discharge medications will be authorized once you are discharged, as it is imperative that you return to your primary care physician (or establish a relationship with a primary care physician if you do not have one) for your post hospital discharge needs so that they can reassess your need for medications and monitor your lab values.  Total Time spent coordinating discharge including counseling, education and face to face time equals 35 minutes.  SignedJeoffrey Massed: Shanker Ghimire 06/28/2018 10:34 AM

## 2018-06-28 NOTE — Progress Notes (Signed)
[facility] RN spoke with unit RN who asked that labs be drawn at or after 1530 as pt received bld transfusion which ended about 1330.

## 2018-06-29 LAB — BPAM RBC
BLOOD PRODUCT EXPIRATION DATE: 201910081621
BLOOD PRODUCT EXPIRATION DATE: 201910082359
BLOOD PRODUCT EXPIRATION DATE: 201910102359
ISSUE DATE / TIME: 201909111547
ISSUE DATE / TIME: 201909120953
ISSUE DATE / TIME: 201909112117
UNIT TYPE AND RH: 5100
Unit Type and Rh: 5100
Unit Type and Rh: 5100

## 2018-06-29 LAB — TYPE AND SCREEN
ABO/RH(D): O POS
Antibody Screen: NEGATIVE
UNIT DIVISION: 0
Unit division: 0
Unit division: 0

## 2018-06-29 NOTE — Progress Notes (Signed)
Katrina Oconnor was discharged from 5w31 via wheelchair.  All discharge paperwork was reviewed with the patient, and she stated she has no further questions.  All personal belongings were accounted for.

## 2018-06-30 LAB — CULTURE, BLOOD (ROUTINE X 2)
CULTURE: NO GROWTH
Culture: NO GROWTH
Special Requests: ADEQUATE

## 2018-07-03 ENCOUNTER — Other Ambulatory Visit: Payer: Self-pay

## 2018-07-03 ENCOUNTER — Encounter (HOSPITAL_COMMUNITY): Payer: Self-pay | Admitting: *Deleted

## 2018-07-03 ENCOUNTER — Inpatient Hospital Stay (HOSPITAL_COMMUNITY)
Admission: EM | Admit: 2018-07-03 | Discharge: 2018-07-05 | DRG: 386 | Disposition: A | Discharge status: Home / Self Care | Payer: Managed Care, Other (non HMO) | Attending: Internal Medicine | Admitting: Internal Medicine

## 2018-07-03 DIAGNOSIS — D72829 Elevated white blood cell count, unspecified: Secondary | ICD-10-CM | POA: Diagnosis not present

## 2018-07-03 DIAGNOSIS — K921 Melena: Secondary | ICD-10-CM | POA: Diagnosis present

## 2018-07-03 DIAGNOSIS — R001 Bradycardia, unspecified: Secondary | ICD-10-CM | POA: Diagnosis present

## 2018-07-03 DIAGNOSIS — K51011 Ulcerative (chronic) pancolitis with rectal bleeding: Secondary | ICD-10-CM | POA: Diagnosis not present

## 2018-07-03 DIAGNOSIS — D62 Acute posthemorrhagic anemia: Secondary | ICD-10-CM | POA: Diagnosis not present

## 2018-07-03 DIAGNOSIS — R8290 Unspecified abnormal findings in urine: Secondary | ICD-10-CM | POA: Diagnosis not present

## 2018-07-03 DIAGNOSIS — K219 Gastro-esophageal reflux disease without esophagitis: Secondary | ICD-10-CM | POA: Diagnosis present

## 2018-07-03 DIAGNOSIS — N390 Urinary tract infection, site not specified: Secondary | ICD-10-CM | POA: Diagnosis present

## 2018-07-03 DIAGNOSIS — R55 Syncope and collapse: Secondary | ICD-10-CM | POA: Diagnosis present

## 2018-07-03 DIAGNOSIS — Z6841 Body Mass Index (BMI) 40.0 and over, adult: Secondary | ICD-10-CM

## 2018-07-03 DIAGNOSIS — Z880 Allergy status to penicillin: Secondary | ICD-10-CM

## 2018-07-03 DIAGNOSIS — K51911 Ulcerative colitis, unspecified with rectal bleeding: Secondary | ICD-10-CM | POA: Diagnosis present

## 2018-07-03 DIAGNOSIS — I959 Hypotension, unspecified: Secondary | ICD-10-CM | POA: Diagnosis present

## 2018-07-03 DIAGNOSIS — Z7952 Long term (current) use of systemic steroids: Secondary | ICD-10-CM

## 2018-07-03 LAB — COMPREHENSIVE METABOLIC PANEL
ALT: 31 U/L (ref 0–44)
ANION GAP: 9 (ref 5–15)
AST: 28 U/L (ref 15–41)
Albumin: 1.9 g/dL — ABNORMAL LOW (ref 3.5–5.0)
Alkaline Phosphatase: 47 U/L (ref 38–126)
BUN: 7 mg/dL (ref 6–20)
CHLORIDE: 109 mmol/L (ref 98–111)
CO2: 24 mmol/L (ref 22–32)
Calcium: 7.6 mg/dL — ABNORMAL LOW (ref 8.9–10.3)
Creatinine, Ser: 0.79 mg/dL (ref 0.44–1.00)
Glucose, Bld: 112 mg/dL — ABNORMAL HIGH (ref 70–99)
POTASSIUM: 3.9 mmol/L (ref 3.5–5.1)
Sodium: 142 mmol/L (ref 135–145)
Total Bilirubin: 0.3 mg/dL (ref 0.3–1.2)
Total Protein: 5.1 g/dL — ABNORMAL LOW (ref 6.5–8.1)

## 2018-07-03 LAB — URINALYSIS, ROUTINE W REFLEX MICROSCOPIC
Bilirubin Urine: NEGATIVE
GLUCOSE, UA: NEGATIVE mg/dL
KETONES UR: NEGATIVE mg/dL
NITRITE: NEGATIVE
PROTEIN: NEGATIVE mg/dL
Specific Gravity, Urine: 1.012 (ref 1.005–1.030)
pH: 9 — ABNORMAL HIGH (ref 5.0–8.0)

## 2018-07-03 LAB — HEMOGLOBIN AND HEMATOCRIT, BLOOD
HEMATOCRIT: 28.6 % — AB (ref 36.0–46.0)
Hemoglobin: 9.1 g/dL — ABNORMAL LOW (ref 12.0–15.0)

## 2018-07-03 LAB — CBC
HEMATOCRIT: 27 % — AB (ref 36.0–46.0)
Hemoglobin: 8.3 g/dL — ABNORMAL LOW (ref 12.0–15.0)
MCH: 24.8 pg — AB (ref 26.0–34.0)
MCHC: 30.7 g/dL (ref 30.0–36.0)
MCV: 80.6 fL (ref 78.0–100.0)
Platelets: 361 10*3/uL (ref 150–400)
RBC: 3.35 MIL/uL — AB (ref 3.87–5.11)
RDW: 17.7 % — ABNORMAL HIGH (ref 11.5–15.5)
WBC: 17.4 10*3/uL — ABNORMAL HIGH (ref 4.0–10.5)

## 2018-07-03 LAB — I-STAT BETA HCG BLOOD, ED (MC, WL, AP ONLY): I-stat hCG, quantitative: <5 m[IU]/mL (ref <5)

## 2018-07-03 LAB — PREPARE RBC (CROSSMATCH)

## 2018-07-03 MED ORDER — ACETAMINOPHEN 650 MG RE SUPP: 650.0000 mg | Freq: Four times a day (QID) | RECTAL | Status: DC | PRN

## 2018-07-03 MED ORDER — METHYLPREDNISOLONE SODIUM SUCC 40 MG IJ SOLR
40.0000 mg | Freq: Once | INTRAMUSCULAR | Status: AC
  Administered 2018-07-03: 40 mg via INTRAVENOUS
  Filled 2018-07-03: qty 1

## 2018-07-03 MED ORDER — PANTOPRAZOLE SODIUM 40 MG PO TBEC
40.0000 mg | DELAYED_RELEASE_TABLET | Freq: Every day | ORAL | Status: DC
  Administered 2018-07-04 – 2018-07-05 (×2): 40 mg via ORAL
  Filled 2018-07-03 (×2): qty 1

## 2018-07-03 MED ORDER — SODIUM CHLORIDE 0.9 % IV BOLUS
1000.0000 mL | Freq: Once | INTRAVENOUS | Status: AC
  Administered 2018-07-03: 1000 mL via INTRAVENOUS

## 2018-07-03 MED ORDER — ONDANSETRON HCL 4 MG PO TABS: 4.0000 mg | ORAL_TABLET | Freq: Four times a day (QID) | ORAL | Status: DC | PRN

## 2018-07-03 MED ORDER — SODIUM CHLORIDE 0.9 % IV SOLN
1.0000 g | Freq: Once | INTRAVENOUS | Status: AC
  Administered 2018-07-03: 1 g via INTRAVENOUS
  Filled 2018-07-03: qty 10

## 2018-07-03 MED ORDER — SODIUM CHLORIDE 0.9% IV SOLUTION
Freq: Once | INTRAVENOUS | Status: AC
  Administered 2018-07-03: 17:00:00 via INTRAVENOUS

## 2018-07-03 MED ORDER — ALBUTEROL SULFATE (2.5 MG/3ML) 0.083% IN NEBU: 2.5000 mg | INHALATION_SOLUTION | Freq: Four times a day (QID) | RESPIRATORY_TRACT | Status: DC | PRN

## 2018-07-03 MED ORDER — MESALAMINE ER 250 MG PO CPCR
1000.0000 mg | ORAL_CAPSULE | Freq: Four times a day (QID) | ORAL | Status: DC
  Administered 2018-07-03 – 2018-07-05 (×7): 1000 mg via ORAL
  Filled 2018-07-03 (×14): qty 4

## 2018-07-03 MED ORDER — ONDANSETRON HCL 4 MG/2ML IJ SOLN: 4.0000 mg | Freq: Four times a day (QID) | INTRAMUSCULAR | Status: DC | PRN

## 2018-07-03 MED ORDER — SODIUM CHLORIDE 0.9 % IV SOLN
INTRAVENOUS | Status: DC
  Administered 2018-07-03: 19:00:00 via INTRAVENOUS
  Administered 2018-07-04: 75 mL/h via INTRAVENOUS
  Administered 2018-07-05: via INTRAVENOUS

## 2018-07-03 MED ORDER — ACETAMINOPHEN 325 MG PO TABS: 650.0000 mg | ORAL_TABLET | Freq: Four times a day (QID) | ORAL | Status: DC | PRN

## 2018-07-03 MED ORDER — METHYLPREDNISOLONE SODIUM SUCC 40 MG IJ SOLR
40.0000 mg | Freq: Two times a day (BID) | INTRAMUSCULAR | Status: DC
  Administered 2018-07-04 – 2018-07-05 (×3): 40 mg via INTRAVENOUS
  Filled 2018-07-03 (×3): qty 1

## 2018-07-03 NOTE — H&P (Addendum)
History and Physical    Katrina Gaussonya L Carne QIO:[id number] DOB: 10/27/1970 DOA: 07/03/2018  Referring MD/NP/PA: Frederik PearMia McDonald, PA-C PCP: Patient, No Pcp Per  Patient coming from: home via EMS  Chief Complaint: Syncope  I have personally briefly reviewed patient's old medical records in [facility]   HPI: Katrina Oconnor is a 47 y.o. female with medical history significant of anemia and newly diagnosed ulcerative colitiswho presents after having to syncopal episodes.  Patient just recently had been hospitalized from 9/4 through 9/9 diagnosed with ulcerative colitis by biopsy on colonoscopy performed by Dr. Evette CristalGanem of GI.  During the hospitalization patient received 2 units of packed red blood cells, started  mesalamine, and had been placed on 40 mg prednisone daily until follow-up with gastroenterology in the outpatient setting.  Since being home patient reports feeling generalized fatigue and malaise.  Last night patient reports having one loose bowel movement with bright red blood per rectum.  Today while taking her son to school had a syncopal episode with loss of consciousness lasting approximately 3 minutes.  Thereafter patient was noted to have one episode of emesis.  Her son was able to stop the car and put it in park before anything happened.  The second episode occurred while patient was having a bowel movement at home.  She reported loss of consciousness for approximately 40 seconds and was significantly diaphoretic.  Her husband who was present called 911 immediately.  Other associated symptoms include some generalized abdominal discomfort.  Denies having any fever, chest pain, palpitations, dysuria, or leg swelling.    ED Course: Upon admission to the emergency department patient was noted to be afebrile with blood pressures relatively soft 97/68.  Patient normally has systolic blood pressures in the 120s.  Initial lab work revealed WBC 17.4 and hemoglobin 8.3(10.4 on 9/9).  Urinalysis appeared to  be contaminated.  Patient was given 1 L of normal saline IV fluids.  Dr. Dulce Sellarutlaw of gastroenterology was consulted due to complaints of continued arm blood in stool.  Patient was started on 40 mg of Solu-Medrol IV every 12 hours for refractory rectal bleeding from ulcerative colitis.  CMP was still pending as initial sample clotted.   Review of Systems  Constitutional: Positive for malaise/fatigue. Negative for chills and fever.  HENT: Negative for congestion and ear discharge.   Eyes: Negative for photophobia and discharge.  Respiratory: Positive for shortness of breath. Negative for cough.   Cardiovascular: Negative for chest pain and leg swelling.  Gastrointestinal: Positive for abdominal pain, blood in stool, nausea and vomiting.  Genitourinary: Negative for dysuria and hematuria.  Musculoskeletal: Negative for joint pain and neck pain.  Skin: Negative for itching.  Neurological: Positive for loss of consciousness.  Endo/Heme/Allergies: Bruises/bleeds easily.  Psychiatric/Behavioral: Negative for memory loss and substance abuse.    Past Medical History:  Diagnosis Date  . Arthritis    both legs sciatic nerve pain at times  . Blood in stool    since april 2015, found microscopic  . GERD (gastroesophageal reflux disease)     Past Surgical History:  Procedure Laterality Date  . BIOPSY  06/26/2018   Procedure: BIOPSY;  Surgeon: Graylin ShiverGanem, Salem F, MD;  [facility];  Service: Endoscopy;;  . CESAREAN SECTION     x 2  . COLONOSCOPY WITH PROPOFOL N/A 05/06/2014   Procedure: COLONOSCOPY WITH PROPOFOL;  Surgeon: Charolett BumpersMartin K Johnson, MD;  Location: [facility] ENDOSCOPY;  Service: Endoscopy;  Laterality: N/A;  . COLONOSCOPY WITH PROPOFOL N/A  06/26/2018   Procedure: COLONOSCOPY WITH PROPOFOL;  Surgeon: Graylin Shiver, MD;  Location: [facility] ENDOSCOPY;  Service: Endoscopy;  Laterality: N/A;  . ESOPHAGOGASTRODUODENOSCOPY (EGD) WITH PROPOFOL N/A 05/06/2014   Procedure: ESOPHAGOGASTRODUODENOSCOPY (EGD)  WITH PROPOFOL;  Surgeon: Charolett Bumpers, MD;  Location: [facility] ENDOSCOPY;  Service: Endoscopy;  Laterality: N/A;     reports that she has never smoked. She has never used smokeless tobacco. She reports that she does not drink alcohol or use drugs.  Allergies  Allergen Reactions  . Penicillins Hives    No family history on file.  Prior to Admission medications   Medication Sig Start Date End Date Taking? Authorizing Provider  mesalamine (PENTASA) 250 MG CR capsule Take 4 capsules (1,000 mg total) by mouth 4 (four) times daily. 06/28/18  Yes Ghimire, Werner Lean, MD  pantoprazole (PROTONIX) 40 MG tablet Take 1 tablet (40 mg total) by mouth daily at 12 noon. 06/28/18  Yes Ghimire, Werner Lean, MD  predniSONE (DELTASONE) 20 MG tablet Take 2 tablets (40 mg total) by mouth daily with breakfast. 06/29/18  Yes Ghimire, Werner Lean, MD  cyclobenzaprine (FLEXERIL) 5 MG tablet Take 1 tablet (5 mg total) by mouth at bedtime. Patient not taking: Reported on 07/03/2018 06/14/18   Georgetta Haber, NP  ondansetron (ZOFRAN) 4 MG tablet Take 1 tablet (4 mg total) by mouth every 8 (eight) hours as needed for nausea or vomiting. 06/14/18   Georgetta Haber, NP    Physical Exam:  Constitutional: Middle-aged female in NAD, calm, comfortable Vitals:   07/03/18 1033 07/03/18 1044 07/03/18 1046  BP:  97/68   Pulse:  99   Resp:  18   Temp:  97.8 F (36.6 C)   TempSrc:  Oral   SpO2: 99% 100%   Weight:   88.5 kg  Height:   4\' 10"  (1.473 m)   Eyes: PERRL, lids and conjunctivae normal ENMT: Mucous membranes are moist. Posterior pharynx clear of any exudate or lesions. Normal dentition.  Neck: normal, supple, no masses, no thyromegaly Respiratory: clear to auscultation bilaterally, no wheezing, no crackles. Normal respiratory effort. No accessory muscle use.  Cardiovascular: Regular rate and rhythm, no murmurs / rubs / gallops. No extremity edema. 2+ pedal pulses. No carotid bruits.  Abdomen: Mild generalized  abdominal tenderness, no masses palpated. No hepatosplenomegaly. Bowel sounds positive.  Musculoskeletal: no clubbing / cyanosis. No joint deformity upper and lower extremities. Good ROM, no contractures. Normal muscle tone.  Skin: no rashes, lesions, ulcers. No induration Neurologic: CN 2-12 grossly intact. Sensation intact, DTR normal. Strength 5/5 in all 4.  Psychiatric: Normal judgment and insight. Alert and oriented x 3. Normal mood.     Labs on Admission: I have personally reviewed following labs and imaging studies  CBC: Recent Labs  Lab 06/27/18 0913 06/28/18 0440 06/28/18 1538 07/03/18 1127  WBC 8.3  --   --  17.4*  HGB 6.5* 7.5* 10.4* 8.3*  HCT 21.1* 24.6* 33.1* 27.0*  MCV 74.8*  --   --  80.6  PLT 146*  --   --  361   Basic Metabolic Panel: Recent Labs  Lab 06/27/18 0913  NA 140  K 3.9  CL 110  CO2 22  GLUCOSE 115*  BUN 5*  CREATININE 0.65  CALCIUM 7.9*   GFR: Estimated Creatinine Clearance: 81.3 mL/min (by C-G formula based on SCr of 0.65 mg/dL). Liver Function Tests: No results for input(s): AST, ALT, ALKPHOS, BILITOT, PROT, ALBUMIN in the last 168 hours. No  results for input(s): LIPASE, AMYLASE in the last 168 hours. No results for input(s): AMMONIA in the last 168 hours. Coagulation Profile: No results for input(s): INR, PROTIME in the last 168 hours. Cardiac Enzymes: No results for input(s): CKTOTAL, CKMB, CKMBINDEX, TROPONINI in the last 168 hours. BNP (last 3 results) No results for input(s): PROBNP in the last 8760 hours. HbA1C: No results for input(s): HGBA1C in the last 72 hours. CBG: No results for input(s): GLUCAP in the last 168 hours. Lipid Profile: No results for input(s): CHOL, HDL, LDLCALC, TRIG, CHOLHDL, LDLDIRECT in the last 72 hours. Thyroid Function Tests: No results for input(s): TSH, T4TOTAL, FREET4, T3FREE, THYROIDAB in the last 72 hours. Anemia Panel: No results for input(s): VITAMINB12, FOLATE, FERRITIN, TIBC, IRON,  RETICCTPCT in the last 72 hours. Urine analysis:    Component Value Date/Time   COLORURINE YELLOW 07/03/2018 1427   APPEARANCEUR CLOUDY (A) 07/03/2018 1427   LABSPEC 1.012 07/03/2018 1427   PHURINE 9.0 (H) 07/03/2018 1427   GLUCOSEU NEGATIVE 07/03/2018 1427   HGBUR LARGE (A) 07/03/2018 1427   BILIRUBINUR NEGATIVE 07/03/2018 1427   KETONESUR NEGATIVE 07/03/2018 1427   PROTEINUR NEGATIVE 07/03/2018 1427   UROBILINOGEN 0.2 06/14/2018 2023   NITRITE NEGATIVE 07/03/2018 1427   LEUKOCYTESUR MODERATE (A) 07/03/2018 1427   Sepsis Labs: Recent Results (from the past 240 hour(s))  Culture, blood (routine x 2)     Status: None   Collection Time: 06/25/18  8:29 AM  Result Value Ref Range Status   Specimen Description BLOOD SITE NOT SPECIFIED  Final   Special Requests   Final    BOTTLES DRAWN AEROBIC ONLY Blood Culture adequate volume   Culture   Final    NO GROWTH 5 DAYS Performed at [facility], [address], [place], [place] [address]    Report Status 06/30/2018 FINAL  Final  Culture, blood (routine x 2)     Status: None   Collection Time: 06/25/18  8:29 AM  Result Value Ref Range Status   Specimen Description BLOOD SITE NOT SPECIFIED  Final   Special Requests   Final    BOTTLES DRAWN AEROBIC ONLY Blood Culture results may not be optimal due to an inadequate volume of blood received in culture bottles   Culture   Final    NO GROWTH 5 DAYS Performed at [facility], [address] [place], [place] [address]    Report Status 06/30/2018 FINAL  Final     Radiological Exams on Admission: No results found.  EKG: Independently reviewed.  Appears to be sinus rhythm at 87 bpm  Assessment/Plan Ulcerative pancolitis  with rectal bleeding: Acute.  Patient presents with complaints of having 2 episodes of bright red blood per rectum and two episodes of syncope.  Hematochezia most likely related to recent diagnosis of ulcerative colitis during last admission from 9/4-9/9.   Patient underwent colonoscopy with biopsy confirming diagnosis.  GI was consulted and recommended 40 mg of IV Solu-Medrol every 12 hours. - Admit to a telemetry bed  - Monitor intake and output - Solu-Medrol 40 mg IV every 12 hours as recommended per GI - Continue mesalamine - Appreciate GI consultative services, will follow-up for further recommendations  Acute blood loss anemia: Hemoglobin at discharge from last hospitalization was 10.4 after receiving 2 units of packed red blood cells, but patient presents with hemoglobin of 8.3.  Patient was typed and screened for possible need of blood products. - Transfuse 1 unit of packed red  blood cells - Posttransfusion recheck H&H - Continue to monitor and transfuse blood products as needed  Syncope: Patient with 2 episodes of syncope once while driving and the second while having a bowel movement.  Suspect blood loss as likely precipitated her events.  Patient without any significant signs to suggest pulmonary embolus. - Follow up telemetry overnight  Leukocytosis: WBC elevated at 17.4.  Suspect symptoms could be related to patient currently being on steroids.  She denies any other infectious symptoms. - Continue to monitor  Abnormal UA - Follow urine culture  GERD - Continue protonix  DVT prophylaxis: SCDs Code Status: full Family Communication: no family present at bedside Disposition Plan: Likely discharged home in 1 to 2 days Consults called: GI  Admission status: Observation  Clydie Braun MD [facility] Pager [phone]   If 7PM-7AM, please contact night-coverage [email or web address] Password [facility]  07/03/2018, 3:36 PM

## 2018-07-03 NOTE — ED Notes (Signed)
Currently alert oriented , states she feels really weak

## 2018-07-03 NOTE — ED Provider Notes (Signed)
[facility] EMERGENCY DEPARTMENT Provider Note   CSN: [id number] Arrival date & time: 07/03/18  1030     History   Chief Complaint Chief Complaint  Patient presents with  . Loss of Consciousness  . GI Bleeding    HPI Katrina Oconnor is a 47 y.o. female with a h/o of GERD and ulcerative colitis who presents to the emergency department with a chief complaint of syncope.  The patient was most recently admitted from 06/21/18 to 06/28/18 for diarrhea, abdominal pain, and fever.  She had a colonoscopy with a biopsy that was suggestive of ulcerative colitis.  GI was consulted during her stay and she was discharged with prednisone and Asacol.  She was advised to follow-up with GI in the clinic this week.  She reports that she was driving her son to school today when she had a syncopal episode.  The patient's son states the episode lasted for 3 minutes.  He states that after about 20 seconds she started having some all over body shaking that he described as "chills".  She had one episode of nonbloody, nonbilious emesis and was then able to start talking.  She reports that she drove herself back to the family's home where she met her husband.  Her husband states that she appeared diaphoretic.  The patient tells reports that she went in to have a bowel movement and after the bowel movement had a second episode where she appeared to slump forward with her eyes open and her arms limit her side for approximately 40 seconds.  She reports that she has been generally weak over the last few days with associated dyspnea on exertion.  She also endorses 2 episodes of large amounts of bright red blood per rectum over the last 2 days. Last episode was last night.  She reports that she has continued to have diarrhea over the last few days. She felt febrile two days ago, but did not measure her temperature.  She denies abdominal pain.   The history is provided by the patient. No language interpreter  was used.    Past Medical History:  Diagnosis Date  . Arthritis    both legs sciatic nerve pain at times  . Blood in stool    since april 2015, found microscopic  . GERD (gastroesophageal reflux disease)     Patient Active Problem List   Diagnosis Date Noted  . Colitis 06/25/2018  . Sinus tachycardia 06/25/2018  . Nausea vomiting and diarrhea 06/22/2018  . Gastroenteritis 06/22/2018    Past Surgical History:  Procedure Laterality Date  . BIOPSY  06/26/2018   Procedure: BIOPSY;  Surgeon: Wonda Horner, MD;  Location: [facility] ENDOSCOPY;  Service: Endoscopy;;  . CESAREAN SECTION     x 2  . COLONOSCOPY WITH PROPOFOL N/A 05/06/2014   Procedure: COLONOSCOPY WITH PROPOFOL;  Surgeon: Garlan Fair, MD;  Location: [facility] ENDOSCOPY;  Service: Endoscopy;  Laterality: N/A;  . COLONOSCOPY WITH PROPOFOL N/A 06/26/2018   Procedure: COLONOSCOPY WITH PROPOFOL;  Surgeon: Wonda Horner, MD;  Location: [facility] ENDOSCOPY;  Service: Endoscopy;  Laterality: N/A;  . ESOPHAGOGASTRODUODENOSCOPY (EGD) WITH PROPOFOL N/A 05/06/2014   Procedure: ESOPHAGOGASTRODUODENOSCOPY (EGD) WITH PROPOFOL;  Surgeon: Garlan Fair, MD;  Location: [facility] ENDOSCOPY;  Service: Endoscopy;  Laterality: N/A;     OB History   None      Home Medications    Prior to Admission medications   Medication Sig Start Date End Date Taking? Authorizing Provider  mesalamine (  PENTASA) 250 MG CR capsule Take 4 capsules (1,000 mg total) by mouth 4 (four) times daily. 06/28/18  Yes Ghimire, Henreitta Leber, MD  pantoprazole (PROTONIX) 40 MG tablet Take 1 tablet (40 mg total) by mouth daily at 12 noon. 06/28/18  Yes Ghimire, Henreitta Leber, MD  predniSONE (DELTASONE) 20 MG tablet Take 2 tablets (40 mg total) by mouth daily with breakfast. 06/29/18  Yes Ghimire, Henreitta Leber, MD  cyclobenzaprine (FLEXERIL) 5 MG tablet Take 1 tablet (5 mg total) by mouth at bedtime. Patient not taking: Reported on 07/03/2018 06/14/18   Zigmund Gottron, NP  ondansetron (ZOFRAN) 4 MG  tablet Take 1 tablet (4 mg total) by mouth every 8 (eight) hours as needed for nausea or vomiting. 06/14/18   Zigmund Gottron, NP    Family History No family history on file.  Social History Social History   Tobacco Use  . Smoking status: Never Smoker  . Smokeless tobacco: Never Used  Substance Use Topics  . Alcohol use: No  . Drug use: No     Allergies   Penicillins   Review of Systems Review of Systems  Constitutional: Positive for fever (resolved). Negative for activity change.  HENT: Negative for congestion.   Eyes: Negative for visual disturbance.  Respiratory: Negative for shortness of breath.   Cardiovascular: Negative for chest pain.  Gastrointestinal: Positive for abdominal pain, diarrhea and vomiting. Negative for nausea.  Genitourinary: Negative for dysuria.  Musculoskeletal: Negative for back pain.  Skin: Negative for rash.  Allergic/Immunologic: Negative for immunocompromised state.  Neurological: Positive for syncope. Negative for headaches.  Psychiatric/Behavioral: Negative for confusion.   Physical Exam Updated Vital Signs BP 97/68 (BP Location: Right Arm)   Pulse 99   Temp 97.8 F (36.6 C) (Oral)   Resp 18   Ht _0  (1.473 m)   Wt 88.5 kg   LMP 06/09/2018 Comment: neg preg test  SpO2 100%   BMI 40.78 kg/m   Physical Exam  Constitutional: No distress.  HENT:  Head: Normocephalic.  Lips appear dry. Mucous membranes appear moist.  Eyes: Conjunctivae are normal. No scleral icterus.  Neck: Normal range of motion. Neck supple.  Cardiovascular: Normal rate, regular rhythm, normal heart sounds and intact distal pulses. Exam reveals no gallop and no friction rub.  No murmur heard. Pulmonary/Chest: Effort normal. No stridor. No respiratory distress. She has no wheezes. She has no rales. She exhibits no tenderness.  Abdominal: Soft. She exhibits no distension and no mass. There is no tenderness. There is no rebound and no guarding. No hernia.    Musculoskeletal: Normal range of motion. She exhibits no edema, tenderness or deformity.  Neurological: She is alert.  Skin: Skin is warm. Capillary refill takes less than 2 seconds. No rash noted. She is not diaphoretic.  Psychiatric: Her behavior is normal.  Nursing note and vitals reviewed.  ED Treatments / Results  Labs (all labs ordered are listed, but only abnormal results are displayed) Labs Reviewed  CBC - Abnormal; Notable for the following components:      Result Value   WBC 17.4 (*)    RBC 3.35 (*)    Hemoglobin 8.3 (*)    HCT 27.0 (*)    MCH 24.8 (*)    RDW 17.7 (*)    All other components within normal limits  URINALYSIS, ROUTINE W REFLEX MICROSCOPIC - Abnormal; Notable for the following components:   APPearance CLOUDY (*)    pH 9.0 (*)    Hgb urine  dipstick LARGE (*)    Leukocytes, UA MODERATE (*)    Bacteria, UA MANY (*)    All other components within normal limits  COMPREHENSIVE METABOLIC PANEL  I-STAT BETA HCG BLOOD, ED ([facility], [facility], [facility] ONLY)  CBG MONITORING, ED  TYPE AND SCREEN    EKG EKG Interpretation  Date/Time:  Tuesday July 03 2018 10:43:26 EDT Ventricular Rate:  87 PR Interval:    QRS Duration: 82 QT Interval:  354 QTC Calculation: 426 R Axis:   159 Text Interpretation:  Atrial fibrillation? possible p waves V5-6 Right ventricular hypertrophy Anterolateral infarct, age indeterminate Confirmed by Sherwood Gambler [phone]) on 07/03/2018 10:53:14 AM Also confirmed by Sherwood Gambler [phone]), editor Philomena Doheny [phone])  on 07/03/2018 12:55:42 PM   Radiology No results found.  Procedures Procedures (including critical care time)  Medications Ordered in ED Medications  sodium chloride 0.9 % bolus 1,000 mL (0 mLs Intravenous Stopped 07/03/18 1331)  methylPREDNISolone sodium succinate (SOLU-MEDROL) 40 mg/mL injection 40 mg (40 mg Intravenous Given 07/03/18 1618)     Initial Impression / Assessment and Plan / ED Course  I have reviewed the  triage vital signs and the nursing notes.  Pertinent labs & imaging results that were available during my care of the patient were reviewed by me and considered in my medical decision making (see chart for details).     47 year old female with a h/o of GERD and ulcerative colitis who presents to the emergency department with a chief complaint of syncope, hematochezia, diarrhea, generalized weakness, and dyspnea on exertion.  She was most recently admitted from June 21, 2018 to June 28, 2018.  During this admission, she had a colonoscopy with a biopsy that demonstrated pan ulcerative colitis.   BP 97/68, down from her baseline of 100-712R systolic.  Afebrile.  No tachycardia.   Labs with leukocytosis of 17.4, likely secondary to corticosteroid use.  Hemoglobin is 8.3, down from 10.4 on June 28, 2017.  Orthostatics performed, however this occurred after the patient was given a 1 L IV fluid bolus. Spoke with Dr. Paulita Fujita with GI who recommended 40 mg IV Solu-Medrol every 12 hours for likely refractory rectal bleeding from ulcerative colitis.  GI will follow. Doubt perforation secondary recent colonoscopy.  CMP had to be redrawn and is pending.  Consulted the hospitalist team and spoke with Dr. Tamala Julian who accepted the patient for admission. The patient appears reasonably stabilized for admission considering the current resources, flow, and capabilities available in the ED at this time, and I doubt any other [facility] requiring further screening and/or treatment in the ED prior to admission.  Final Clinical Impressions(s) / ED Diagnoses   Final diagnoses:  Ulcerative pancolitis with rectal bleeding [facility])    ED Discharge Orders    None       Joanne Gavel, PA-C 07/03/18 1629    Sherwood Gambler, MD 07/04/18 1511

## 2018-07-03 NOTE — ED Triage Notes (Signed)
Pt BIB [facility] d/t Syncopal episode while driving this morning. Per EMS husband came and got her & took her home.  while home Pt had syncopal episode while sitting in chair. Pt had a Large BM at home that was bright red    Prev hosp for ulcerative colitis 1wk ago w/ 2 Blood transfusion

## 2018-07-04 ENCOUNTER — Other Ambulatory Visit: Payer: Self-pay | Admitting: Physician Assistant

## 2018-07-04 DIAGNOSIS — R001 Bradycardia, unspecified: Secondary | ICD-10-CM | POA: Diagnosis present

## 2018-07-04 DIAGNOSIS — K921 Melena: Secondary | ICD-10-CM | POA: Diagnosis present

## 2018-07-04 DIAGNOSIS — K51011 Ulcerative (chronic) pancolitis with rectal bleeding: Secondary | ICD-10-CM | POA: Diagnosis present

## 2018-07-04 DIAGNOSIS — R55 Syncope and collapse: Secondary | ICD-10-CM | POA: Diagnosis present

## 2018-07-04 DIAGNOSIS — N390 Urinary tract infection, site not specified: Secondary | ICD-10-CM | POA: Diagnosis present

## 2018-07-04 DIAGNOSIS — R8290 Unspecified abnormal findings in urine: Secondary | ICD-10-CM | POA: Diagnosis not present

## 2018-07-04 DIAGNOSIS — Z7952 Long term (current) use of systemic steroids: Secondary | ICD-10-CM | POA: Diagnosis not present

## 2018-07-04 DIAGNOSIS — Z880 Allergy status to penicillin: Secondary | ICD-10-CM | POA: Diagnosis not present

## 2018-07-04 DIAGNOSIS — K219 Gastro-esophageal reflux disease without esophagitis: Secondary | ICD-10-CM | POA: Diagnosis present

## 2018-07-04 DIAGNOSIS — D62 Acute posthemorrhagic anemia: Secondary | ICD-10-CM | POA: Diagnosis present

## 2018-07-04 DIAGNOSIS — Z6841 Body Mass Index (BMI) 40.0 and over, adult: Secondary | ICD-10-CM | POA: Diagnosis not present

## 2018-07-04 DIAGNOSIS — I959 Hypotension, unspecified: Secondary | ICD-10-CM | POA: Diagnosis present

## 2018-07-04 LAB — BASIC METABOLIC PANEL
ANION GAP: 6 (ref 5–15)
BUN: 7 mg/dL (ref 6–20)
CO2: 25 mmol/L (ref 22–32)
Calcium: 8.1 mg/dL — ABNORMAL LOW (ref 8.9–10.3)
Chloride: 110 mmol/L (ref 98–111)
Creatinine, Ser: 0.73 mg/dL (ref 0.44–1.00)
GFR calc non Af Amer: >60 mL/min (ref >60)
GLUCOSE: 104 mg/dL — AB (ref 70–99)
POTASSIUM: 4.4 mmol/L (ref 3.5–5.1)
Sodium: 141 mmol/L (ref 135–145)

## 2018-07-04 LAB — CBC
HEMATOCRIT: 25.5 % — AB (ref 36.0–46.0)
HEMOGLOBIN: 7.8 g/dL — AB (ref 12.0–15.0)
MCH: 25.7 pg — ABNORMAL LOW (ref 26.0–34.0)
MCHC: 30.6 g/dL (ref 30.0–36.0)
MCV: 83.9 fL (ref 78.0–100.0)
Platelets: 315 10*3/uL (ref 150–400)
RBC: 3.04 MIL/uL — AB (ref 3.87–5.11)
RDW: 17.6 % — ABNORMAL HIGH (ref 11.5–15.5)
WBC: 10.7 10*3/uL — ABNORMAL HIGH (ref 4.0–10.5)

## 2018-07-04 LAB — TYPE AND SCREEN
ABO/RH(D): O POS
Antibody Screen: NEGATIVE
Unit division: 0

## 2018-07-04 LAB — BPAM RBC
Blood Product Expiration Date: 201910082359
ISSUE DATE / TIME: 201909171705
UNIT TYPE AND RH: 5100

## 2018-07-04 LAB — HEMOGLOBIN AND HEMATOCRIT, BLOOD
HCT: 26.1 % — ABNORMAL LOW (ref 36.0–46.0)
Hemoglobin: 8.1 g/dL — ABNORMAL LOW (ref 12.0–15.0)

## 2018-07-04 MED ORDER — SODIUM CHLORIDE 0.9 % IV SOLN
1.0000 g | INTRAVENOUS | Status: DC
  Administered 2018-07-04: 1 g via INTRAVENOUS
  Filled 2018-07-04 (×2): qty 10

## 2018-07-04 MED ORDER — BOOST / RESOURCE BREEZE PO LIQD CUSTOM
1.0000 | Freq: Two times a day (BID) | ORAL | Status: DC
  Administered 2018-07-04: 1 via ORAL

## 2018-07-04 NOTE — Progress Notes (Signed)
Send attending MD text page asking to discontinue continuous pulse ox because pt is 100% on RA.

## 2018-07-04 NOTE — Progress Notes (Signed)
Initial Nutrition Assessment  DOCUMENTATION CODES:   Morbid obesity  INTERVENTION:   -Boost Breeze po BID, each supplement provides 250 kcal and 9 grams of protein  NUTRITION DIAGNOSIS:   Inadequate oral intake related to altered GI function as evidenced by per patient/family report.  GOAL:   Patient will meet greater than or equal to 90% of their needs  MONITOR:   PO intake, Supplement acceptance, Labs, Weight trends, Skin, I & O's  REASON FOR ASSESSMENT:   Malnutrition Screening Tool    ASSESSMENT:   Katrina Oconnor is a 47 y.o. female with medical history significant of anemia and newly diagnosed ulcerative colitiswho presents after having to syncopal episodes.  Pt admitted with syncopal episodes.   Spoke with pt and husband at bedside. Both confirm that pt was just discharged from the hospital last week. Prior to recent hospitalization, pt shares that she had very poor oral intake for approximately two weeks, however, this has improved greatly over the past week. She reports consuming 3-4 meals per day (Breakfast: eggs and sausage or oatmeal and fruit; Lunch: sandwich; Dinner: meat and two vegetables). She shares that she ate all of her lunch prior to visit.   Pt endorses a 15# wt loss over the past 2 weeks, however, this is not consistent with wt hx, which indicates a slight weight gain.   Pt and husband express concern about pt's prior poor oral intake. Pt denies consuming supplements during last hospitalization although they were ordered per prior RD notes. Pt amenable to supplement, however, requesting a non-milk supplement due to slight dairy intolerance. Discussed importance of good meal and supplement intake to promote healing.   Labs reviewed.   NUTRITION - FOCUSED PHYSICAL EXAM:    Most Recent Value  Orbital Region  No depletion  Upper Arm Region  No depletion  Thoracic and Lumbar Region  No depletion  Buccal Region  No depletion  Temple Region  No depletion   Clavicle Bone Region  No depletion  Clavicle and Acromion Bone Region  No depletion  Scapular Bone Region  No depletion  Dorsal Hand  No depletion  Patellar Region  No depletion  Anterior Thigh Region  No depletion  Posterior Calf Region  No depletion  Edema (RD Assessment)  None  Hair  Reviewed  Eyes  Reviewed  Mouth  Reviewed  Skin  Reviewed  Nails  Reviewed       Diet Order:   Diet Order            Diet regular Room service appropriate? Yes; Fluid consistency: Thin  Diet effective now              EDUCATION NEEDS:   Education needs have been addressed  Skin:  Skin Assessment: Reviewed RN Assessment  Last BM:  07/03/18  Height:   Ht Readings from Last 1 Encounters:  07/03/18 4\' 10"  (1.473 m)    Weight:   Wt Readings from Last 1 Encounters:  07/03/18 88.5 kg    Ideal Body Weight:  44.1 kg  BMI:  Body mass index is 40.78 kg/m.  Estimated Nutritional Needs:   Kcal:  1550-1750  Protein:  75-90 grams  Fluid:  1.5-1.7 L   Shenell Rogalski A. Mayford KnifeWilliams, RD, LDN, CDE Pager: [phone] After hours Pager: [phone]

## 2018-07-04 NOTE — Progress Notes (Signed)
Patient ambulated to bathroom, sitting up in chair in room. No complaints voiced.

## 2018-07-04 NOTE — Progress Notes (Signed)
MD at bedside. 

## 2018-07-04 NOTE — Progress Notes (Signed)
Spoke w/ Toniann FailWendy in PT. She stated that she'll text page it out - not sure if it'll be done this morning, but it'll be done today. RN Millie aware.

## 2018-07-04 NOTE — Progress Notes (Signed)
Spoke w/ ChadKong in dietary services - requested brown sugar be sent up per request of Millie RN.

## 2018-07-04 NOTE — Progress Notes (Signed)
PROGRESS NOTE    Katrina Oconnor  ZOX:[id number] DOB: 04-Jun-1971 DOA: 07/03/2018 PCP: Patient, No Pcp Per  Outpatient Specialists:     Brief Narrative:  Katrina Oconnor is a 47 y.o. female with medical history significant of anemia and newly diagnosed ulcerative colitiswho presents after having to syncopal episodes.  Work-up is in progress.  Cardiology and GI input is appreciated.  Telemetry monitoring has revealed sinus bradycardia in the 40s.  Likely 30-day Holter monitor on discharge.  We will continue to monitor H/H and transfuse with packed red blood cells as deemed necessary.  Continue treatment for ulcerative colitis.  Further management will depend on hospital course.  Assessment & Plan:   Principal Problem:   Ulcerative colitis with rectal bleeding (HCC) Active Problems:   Acute blood loss anemia   Abnormal urinalysis   Leukocytosis   Syncope, vasovagal  Ulcerative pancolitis: Rectal bleed has improved significantly  since treatment for ulcerative colitis began. GI input is appreciated. Continue steroids (IV for first 24 hours, then oral) Continue to monitor H/H.   Anemia:  Likely multifactorial  History of ulcerative colitis  History of blood loss  Monitor H/H, and transfuse packed red blood cells as necessary.    Syncope:  Patient with 2 episodes of syncope once while driving and the second while having a bowel movement.   Possible secondary to vasovagal phenomenon versus arrhythmia.  Query role of anemia.   Telemetry monitoring reveals sinus bradycardia to the 40s.   Patient will need outpatient 30-day telemetry monitoring.   Further management will depend on hospital course.    Leukocytosis:  WBC elevated was 17.4 on presentation.   WBC is down to 10.7  Possibly reactive.   Patient is also on steroids.   Pyuria/bacteriuria Urine culture IV Rocephin Continue to monitor.    Abnormal UA/possible UTI: - Follow urine culture -IV Rocephin  GERD - Continue  protonix  DVT prophylaxis: SCDs Code Status: full Family Communication: no family present at bedside Disposition Plan: Likely discharged home in 1 to 2 days  Consultants:   GI  Cardiology  Procedures:   None  Antimicrobials:   Start IV Rocephin   Subjective: No new complaints No chest No shortness of breath No fever or chills  Intermittent palpitations.    Objective: Vitals:   07/03/18 1917 07/04/18 0017 07/04/18 0614 07/04/18 0900  BP: 123/82 (!) 101/58 112/75 113/79  Pulse: 60 (!) 57 62 64  Resp: 18 16 16 20   Temp: 97.9 F (36.6 C) 97.9 F (36.6 C) 98 F (36.7 C) 97.7 F (36.5 C)  TempSrc: Axillary Oral Oral Oral  SpO2: 99% 100% 100%   Weight:      Height:        Intake/Output Summary (Last 24 hours) at 07/04/2018 1020 Last data filed at 07/04/2018 0500 Gross per 24 hour  Intake 1694.75 ml  Output -  Net 1694.75 ml   Filed Weights   07/03/18 1046  Weight: 88.5 kg    Examination:  General exam: Appears calm and comfortable.  Morbidly obese Respiratory system: Clear to auscultation. Respiratory effort normal. Cardiovascular system: S1 & S2 heard.  Fullness of the ankle. Gastrointestinal system: Abdomen is obese, nondistended, soft and nontender. No organomegaly or masses felt. Normal bowel sounds heard. Central nervous system: Alert and oriented. No focal neurological deficits. Extremities: Symmetric 5 x 5 power.   Data Reviewed: I have personally reviewed following labs and imaging studies  CBC: Recent Labs  Lab 06/28/18 1538 07/03/18  1127 07/03/18 2207 07/04/18 0454 07/04/18 0921  WBC  --  17.4*  --  10.7*  --   HGB 10.4* 8.3* 9.1* 7.8* 8.1*  HCT 33.1* 27.0* 28.6* 25.5* 26.1*  MCV  --  80.6  --  83.9  --   PLT  --  361  --  315  --    Basic Metabolic Panel: Recent Labs  Lab 07/03/18 1605 07/04/18 0454  NA 142 141  K 3.9 4.4  CL 109 110  CO2 24 25  GLUCOSE 112* 104*  BUN 7 7  CREATININE 0.79 0.73  CALCIUM 7.6* 8.1*    GFR: Estimated Creatinine Clearance: 81.3 mL/min (by C-G formula based on SCr of 0.73 mg/dL). Liver Function Tests: Recent Labs  Lab 07/03/18 1605  AST 28  ALT 31  ALKPHOS 47  BILITOT 0.3  PROT 5.1*  ALBUMIN 1.9*   No results for input(s): LIPASE, AMYLASE in the last 168 hours. No results for input(s): AMMONIA in the last 168 hours. Coagulation Profile: No results for input(s): INR, PROTIME in the last 168 hours. Cardiac Enzymes: No results for input(s): CKTOTAL, CKMB, CKMBINDEX, TROPONINI in the last 168 hours. BNP (last 3 results) No results for input(s): PROBNP in the last 8760 hours. HbA1C: No results for input(s): HGBA1C in the last 72 hours. CBG: No results for input(s): GLUCAP in the last 168 hours. Lipid Profile: No results for input(s): CHOL, HDL, LDLCALC, TRIG, CHOLHDL, LDLDIRECT in the last 72 hours. Thyroid Function Tests: No results for input(s): TSH, T4TOTAL, FREET4, T3FREE, THYROIDAB in the last 72 hours. Anemia Panel: No results for input(s): VITAMINB12, FOLATE, FERRITIN, TIBC, IRON, RETICCTPCT in the last 72 hours. Urine analysis:    Component Value Date/Time   COLORURINE YELLOW 07/03/2018 1427   APPEARANCEUR CLOUDY (A) 07/03/2018 1427   LABSPEC 1.012 07/03/2018 1427   PHURINE 9.0 (H) 07/03/2018 1427   GLUCOSEU NEGATIVE 07/03/2018 1427   HGBUR LARGE (A) 07/03/2018 1427   BILIRUBINUR NEGATIVE 07/03/2018 1427   KETONESUR NEGATIVE 07/03/2018 1427   PROTEINUR NEGATIVE 07/03/2018 1427   UROBILINOGEN 0.2 06/14/2018 2023   NITRITE NEGATIVE 07/03/2018 1427   LEUKOCYTESUR MODERATE (A) 07/03/2018 1427   Sepsis Labs: @LABRCNTIP (procalcitonin:4,lacticidven:4)  ) Recent Results (from the past 240 hour(s))  Culture, blood (routine x 2)     Status: None   Collection Time: 06/25/18  8:29 AM  Result Value Ref Range Status   Specimen Description BLOOD SITE NOT SPECIFIED  Final   Special Requests   Final    BOTTLES DRAWN AEROBIC ONLY Blood Culture adequate  volume   Culture   Final    NO GROWTH 5 DAYS Performed at [facility], [address] [place], [place] [address]    Report Status 06/30/2018 FINAL  Final  Culture, blood (routine x 2)     Status: None   Collection Time: 06/25/18  8:29 AM  Result Value Ref Range Status   Specimen Description BLOOD SITE NOT SPECIFIED  Final   Special Requests   Final    BOTTLES DRAWN AEROBIC ONLY Blood Culture results may not be optimal due to an inadequate volume of blood received in culture bottles   Culture   Final    NO GROWTH 5 DAYS Performed at [facility], [address] [place], [place] [address]    Report Status 06/30/2018 FINAL  Final         Radiology Studies: No results found.      Scheduled Meds: .  mesalamine  1,000 mg Oral QID  . methylPREDNISolone (SOLU-MEDROL) injection  40 mg Intravenous Q12H  . pantoprazole  40 mg Oral Q1200   Continuous Infusions: . sodium chloride 75 mL/hr (07/04/18 1004)     LOS: 0 days    Time spent: 25 minutes    Berton Mount, MD  [facility] Pager #: [phone] 7PM-7AM contact night coverage as above

## 2018-07-04 NOTE — Progress Notes (Signed)
Team A w/ cardiology - Jeraldine LootsHammond PA paged to Dr. Dartha Lodgegbata @ [phone].

## 2018-07-04 NOTE — Consult Note (Addendum)
Cardiology Consultation:   Patient ID: Katrina Oconnor MRN: [id number]; DOB: [birth year]  Admit date: 07/03/2018 Date of Consult: 07/04/2018  Primary Care Provider: Patient, No Pcp Per Primary Cardiologist: New to Dr.Demarrius Guerrero   Patient Profile:   Katrina Oconnor is a 47 y.o. female with a hx of GERD and recent diagnosis of ulcerative colitis who is being seen today for the evaluation of syncope at the request of Dr. Dartha Lodge.  Admitted 06/21/2018 to 06/28/2018 for new diagnosis of ulcerative colitis.  Presenting symptoms for diarrhea, abdominal pain and fever.  She was started on prednisone and Asacol.  History of Present Illness:   Katrina Oconnor had syncope episodes yesterday leading to further evaluation.  She felt dizziness while walking to car to drop off his 29 year old son to his school.  She stopped her car at a stop sign downhill.  Next thing she remembered that her son stepping on her to wake her up.  Her son was sitting on passenger side.  She lost consciousness for about 3 minutes per son.  She drove home and went to use bathroom.  She was diaphoretic.  She had a blood in her stool.  She slumped forward with eyes open while on commode.  Witnessed by husband.  EMS came and brought her to ER.  She denies prodromal symptoms of palpitation, chest pain or shortness of breath.  She had 2 episode of large amount of blood in his stool in past few days.  She was noted hypotensive at 97/68 upon arrival to ER.  WBC 17.4.  Hemoglobin 8.3 which is down from prior.  She received 1 unit of blood with improvement on her hemoglobin level.  Started on IV Solu-Medrol for refractory bleeding from ulcerative colitis.  She was admitted for further work-up.  Patient endorsed having one episode of syncope many years ago while she was in "excruciating pain due to sciatic nerve".  No prodromal symptoms at that time.  She never had any cardiac evaluation.  Denies history of tobacco smoking or alcohol abuse.  No family history  of CAD.  She denies orthopnea, PND, lower extremity edema, palpitation or headache.  Past Medical History:  Diagnosis Date  . Arthritis    both legs sciatic nerve pain at times  . Blood in stool    since april 2015, found microscopic  . GERD (gastroesophageal reflux disease)     Past Surgical History:  Procedure Laterality Date  . BIOPSY  06/26/2018   Procedure: BIOPSY;  Surgeon: Graylin Shiver, MD;  Location: [facility] ENDOSCOPY;  Service: Endoscopy;;  . CESAREAN SECTION     x 2  . COLONOSCOPY WITH PROPOFOL N/A 05/06/2014   Procedure: COLONOSCOPY WITH PROPOFOL;  Surgeon: Charolett Bumpers, MD;  Location: [facility] ENDOSCOPY;  Service: Endoscopy;  Laterality: N/A;  . COLONOSCOPY WITH PROPOFOL N/A 06/26/2018   Procedure: COLONOSCOPY WITH PROPOFOL;  Surgeon: Graylin Shiver, MD;  Location: [facility] ENDOSCOPY;  Service: Endoscopy;  Laterality: N/A;  . ESOPHAGOGASTRODUODENOSCOPY (EGD) WITH PROPOFOL N/A 05/06/2014   Procedure: ESOPHAGOGASTRODUODENOSCOPY (EGD) WITH PROPOFOL;  Surgeon: Charolett Bumpers, MD;  Location: [facility] ENDOSCOPY;  Service: Endoscopy;  Laterality: N/A;     Inpatient Medications: Scheduled Meds: . mesalamine  1,000 mg Oral QID  . methylPREDNISolone (SOLU-MEDROL) injection  40 mg Intravenous Q12H  . pantoprazole  40 mg Oral Q1200   Continuous Infusions: . sodium chloride 75 mL/hr (07/04/18 1004)   PRN Meds: acetaminophen **OR** acetaminophen, albuterol, ondansetron **OR** ondansetron (ZOFRAN) IV  Allergies:  Allergies  Allergen Reactions  . Penicillins Hives    Social History:   Social History   Socioeconomic History  . Marital status: Married    Spouse name: Not on file  . Number of children: Not on file  . Years of education: Not on file  . Highest education level: Not on file  Occupational History  . Not on file  Social Needs  . Financial resource strain: Not on file  . Food insecurity:    Worry: Not on file    Inability: Not on file  . Transportation needs:     Medical: Not on file    Non-medical: Not on file  Tobacco Use  . Smoking status: Never Smoker  . Smokeless tobacco: Never Used  Substance and Sexual Activity  . Alcohol use: No  . Drug use: No  . Sexual activity: Not on file  Lifestyle  . Physical activity:    Days per week: Not on file    Minutes per session: Not on file  . Stress: Not on file  Relationships  . Social connections:    Talks on phone: Not on file    Gets together: Not on file    Attends religious service: Not on file    Active member of club or organization: Not on file    Attends meetings of clubs or organizations: Not on file    Relationship status: Not on file  . Intimate partner violence:    Fear of current or ex partner: Not on file    Emotionally abused: Not on file    Physically abused: Not on file    Forced sexual activity: Not on file  Other Topics Concern  . Not on file  Social History Narrative  . Not on file    Family History:   Patient denies family history of CAD or stroke  ROS:  Please see the history of present illness.  All other ROS reviewed and negative.     Physical Exam/Data:   Vitals:   07/03/18 1917 07/04/18 0017 07/04/18 0614 07/04/18 0900  BP: 123/82 (!) 101/58 112/75 113/79  Pulse: 60 (!) 57 62 64  Resp: 18 16 16 20   Temp: 97.9 F (36.6 C) 97.9 F (36.6 C) 98 F (36.7 C) 97.7 F (36.5 C)  TempSrc: Axillary Oral Oral Oral  SpO2: 99% 100% 100%   Weight:      Height:        Intake/Output Summary (Last 24 hours) at 07/04/2018 1023 Last data filed at 07/04/2018 0500 Gross per 24 hour  Intake 1694.75 ml  Output -  Net 1694.75 ml   Filed Weights   07/03/18 1046  Weight: 88.5 kg   Body mass index is 40.78 kg/m.  General:  Well nourished, well developed, in no acute distress HEENT: normal Lymph: no adenopathy Neck: no JVD Endocrine:  No thryomegaly Vascular: No carotid bruits; FA pulses 2+ bilaterally without bruits  Cardiac:  normal S1, S2; RRR; no murmur    Lungs:  clear to auscultation bilaterally, no wheezing, rhonchi or rales  Abd: soft, nontender, no hepatomegaly  Ext: no edema Musculoskeletal:  No deformities, BUE and BLE strength normal and equal Skin: warm and dry  Neuro:  CNs 2-12 intact, no focal abnormalities noted Psych:  Normal affect   EKG:  The EKG was personally reviewed and demonstrates: Initial EKG showed rate of 85 with possible A. fib however she has clear P waves.  Repeat EKG shows sinus rhythm at controlled  ventricular rate without acute abnormality. Telemetry:  Telemetry was personally reviewed and demonstrates: Sinus bradycardia at rate of 40s to 50s  Relevant CV Studies: Echocardiogram 06/25/2018 Study Conclusions  - Left ventricle: The cavity size was normal. Systolic function was   normal. The estimated ejection fraction was in the range of 60%   to 65%. Wall motion was normal; there were no regional wall   motion abnormalities. Doppler parameters are consistent with   abnormal left ventricular relaxation (grade 1 diastolic   dysfunction). - Aortic valve: Transvalvular velocity was within the normal range.   There was no stenosis. There was no regurgitation. - Mitral valve: There was trivial regurgitation. - Left atrium: The atrium was moderately dilated. - Right ventricle: The cavity size was normal. Wall thickness was   normal. Systolic function was normal. - Tricuspid valve: There was trivial regurgitation. - Pulmonary arteries: Systolic pressure was mildly increased. PA   peak pressure: 42 mm Hg (S).  Laboratory Data:  Chemistry Recent Labs  Lab 07/03/18 1605 07/04/18 0454  NA 142 141  K 3.9 4.4  CL 109 110  CO2 24 25  GLUCOSE 112* 104*  BUN 7 7  CREATININE 0.79 0.73  CALCIUM 7.6* 8.1*  GFRNONAA >60 >60  GFRAA >60 >60  ANIONGAP 9 6    Recent Labs  Lab 07/03/18 1605  PROT 5.1*  ALBUMIN 1.9*  AST 28  ALT 31  ALKPHOS 47  BILITOT 0.3   Hematology Recent Labs  Lab 07/03/18 1127  07/03/18 2207 07/04/18 0454 07/04/18 0921  WBC 17.4*  --  10.7*  --   RBC 3.35*  --  3.04*  --   HGB 8.3* 9.1* 7.8* 8.1*  HCT 27.0* 28.6* 25.5* 26.1*  MCV 80.6  --  83.9  --   MCH 24.8*  --  25.7*  --   MCHC 30.7  --  30.6  --   RDW 17.7*  --  17.6*  --   PLT 361  --  315  --    Radiology/Studies:  No results found.  Assessment and Plan:   1. Syncope Uncertain etiology.  Differential includes symptomatic anemia, vasovagal response or arrhythmia.  Patient denies any prodromal symptoms.  First syncope episode occurred while driving (he did not felt dizziness prior to getting in car).  Second episode occurred on commode. -Patient is bradycardic here in 40s to 50s.  She is not on any rate control agent.  EKG  documented as possible atrial fibrillation yesterday however she clearly has P waves. -No driving for 6 months.  She will need 30-day event monitor as outpatient. -Recent echocardiogram 06/25/2018 showed normal LV function with grade 1 diastolic dysfunction.  No valvular abnormality.  2.  Symptomatic anemia -Transfused 1 unit of blood yesterday.  Hemoglobin 8.1 today.  3.  Ulcerative colitis -Per GI  Will follow with you. Continue to monitor on tele. Will set up event monitor.   For questions or updates, please contact [facility] Please consult [email or web address] for contact info under     Lorelei Pont, PA  07/04/2018 10:23 AM   I have personally seen and examined this patient with Mr. Iver Nestle. I agree with the assessment and plan as outlined above. She has no prior cardiac history. Echo last week with normal LV systolic function, no valve disease. Now admitted with syncope. She was found to be anemic from GI bleeding related to her ulcerative colitis.  No chest pain or dyspnea. No palpitations.  Labs reviewed by  me.  EKG personally reviewed by me shows NSR, with nonspecific ST abnormality/J point elevation My exam:  General: Well developed, well nourished,  NAD  HEENT: OP clear, mucus membranes moist  SKIN: warm, dry. No rashes. Neuro: No focal deficits  Musculoskeletal: Muscle strength 5/5 all ext  Psychiatric: Mood and affect normal  Neck: No JVD, no carotid bruits, no thyromegaly, no lymphadenopathy.  Lungs:Clear bilaterally, no wheezes, rhonci, crackles Cardiovascular: Regular rate and rhythm. No murmurs, gallops or rubs. Abdomen:Soft. Bowel sounds present. Non-tender.  Extremities: No lower extremity edema. Pulses are 2 + in the bilateral DP/PT.  Plan: Syncope in setting of anemia. No structural disease noted on echo last week. No worrisome findings on cardiac exam. This is likely a vasovagal event in setting of anemia but would recommend to keep on telemetry while she is an inpatient and then we will arrange a 30 day event monitor after discharge. We will see her tomorrow.   Verne CarrowChristopher Rozlynn Lippold 07/04/2018 11:03 AM

## 2018-07-04 NOTE — Consult Note (Signed)
[facility] Consultation Note  Referring Provider: Dr. Dartha Lodge [facility]) Primary Care Physician:  Patient, No Pcp Per Primary Gastroenterologist:  Dr. Herbert Moors  Reason for Consultation:  Anemia, blood in stool  HPI: Katrina Oconnor is a 47 y.o. female admitted for blood in stool and (pre)syncope with anemia.  Diagnosed and hospitalized last week with ulcerative colitis, sent home on Pentasa and prednisone.  Patient has had a few episodes of blood in stool and diarrhea, but much improved compared to symptoms leading to her initial hospitalization.  No melena or abdominal pain or nausea or vomiting or hematemesis.   Past Medical History:  Diagnosis Date  . Arthritis    both legs sciatic nerve pain at times  . Blood in stool    since april 2015, found microscopic  . GERD (gastroesophageal reflux disease)     Past Surgical History:  Procedure Laterality Date  . BIOPSY  06/26/2018   Procedure: BIOPSY;  Surgeon: Graylin Shiver, MD;  Location: [facility] ENDOSCOPY;  Service: Endoscopy;;  . CESAREAN SECTION     x 2  . COLONOSCOPY WITH PROPOFOL N/A 05/06/2014   Procedure: COLONOSCOPY WITH PROPOFOL;  Surgeon: Charolett Bumpers, MD;  Location: [facility] ENDOSCOPY;  Service: Endoscopy;  Laterality: N/A;  . COLONOSCOPY WITH PROPOFOL N/A 06/26/2018   Procedure: COLONOSCOPY WITH PROPOFOL;  Surgeon: Graylin Shiver, MD;  Location: [facility] ENDOSCOPY;  Service: Endoscopy;  Laterality: N/A;  . ESOPHAGOGASTRODUODENOSCOPY (EGD) WITH PROPOFOL N/A 05/06/2014   Procedure: ESOPHAGOGASTRODUODENOSCOPY (EGD) WITH PROPOFOL;  Surgeon: Charolett Bumpers, MD;  Location: [facility] ENDOSCOPY;  Service: Endoscopy;  Laterality: N/A;    Prior to Admission medications   Medication Sig Start Date End Date Taking? Authorizing Provider  mesalamine (PENTASA) 250 MG CR capsule Take 4 capsules (1,000 mg total) by mouth 4 (four) times daily. 06/28/18  Yes Ghimire, Werner Lean, MD  pantoprazole (PROTONIX) 40 MG tablet Take 1 tablet (40 mg total) by mouth  daily at 12 noon. 06/28/18  Yes Ghimire, Werner Lean, MD  predniSONE (DELTASONE) 20 MG tablet Take 2 tablets (40 mg total) by mouth daily with breakfast. 06/29/18  Yes Ghimire, Werner Lean, MD  cyclobenzaprine (FLEXERIL) 5 MG tablet Take 1 tablet (5 mg total) by mouth at bedtime. Patient not taking: Reported on 07/03/2018 06/14/18   Georgetta Haber, NP  ondansetron (ZOFRAN) 4 MG tablet Take 1 tablet (4 mg total) by mouth every 8 (eight) hours as needed for nausea or vomiting. 06/14/18   Georgetta Haber, NP    Current Facility-Administered Medications  Medication Dose Route Frequency Provider Last Rate Last Dose  . 0.9 %  sodium chloride infusion   Intravenous Continuous Madelyn Flavors A, MD 75 mL/hr at 07/04/18 1004 75 mL/hr at 07/04/18 1004  . acetaminophen (TYLENOL) tablet 650 mg  650 mg Oral Q6H PRN Clydie Braun, MD       Or  . acetaminophen (TYLENOL) suppository 650 mg  650 mg Rectal Q6H PRN Smith, Rondell A, MD      . albuterol (PROVENTIL) (2.5 MG/3ML) 0.083% nebulizer solution 2.5 mg  2.5 mg Nebulization Q6H PRN Katrinka Blazing, Rondell A, MD      . mesalamine (PENTASA) CR capsule 1,000 mg  1,000 mg Oral QID Madelyn Flavors A, MD   1,000 mg at 07/03/18 2202  . methylPREDNISolone sodium succinate (SOLU-MEDROL) 40 mg/mL injection 40 mg  40 mg Intravenous Q12H Smith, Rondell A, MD   40 mg at 07/04/18 0458  . ondansetron (ZOFRAN) tablet 4 mg  4 mg Oral Q6H PRN Madelyn Flavors A, MD       Or  . ondansetron (ZOFRAN) injection 4 mg  4 mg Intravenous Q6H PRN Smith, Rondell A, MD      . pantoprazole (PROTONIX) EC tablet 40 mg  40 mg Oral Q1200 Clydie Braun, MD        Allergies as of 07/03/2018 - Review Complete 07/03/2018  Allergen Reaction Noted  . Penicillins Hives 12/11/2013    No family history on file.  Social History   Socioeconomic History  . Marital status: Married    Spouse name: Not on file  . Number of children: Not on file  . Years of education: Not on file  . Highest education  level: Not on file  Occupational History  . Not on file  Social Needs  . Financial resource strain: Not on file  . Food insecurity:    Worry: Not on file    Inability: Not on file  . Transportation needs:    Medical: Not on file    Non-medical: Not on file  Tobacco Use  . Smoking status: Never Smoker  . Smokeless tobacco: Never Used  Substance and Sexual Activity  . Alcohol use: No  . Drug use: No  . Sexual activity: Not on file  Lifestyle  . Physical activity:    Days per week: Not on file    Minutes per session: Not on file  . Stress: Not on file  Relationships  . Social connections:    Talks on phone: Not on file    Gets together: Not on file    Attends religious service: Not on file    Active member of club or organization: Not on file    Attends meetings of clubs or organizations: Not on file    Relationship status: Not on file  . Intimate partner violence:    Fear of current or ex partner: Not on file    Emotionally abused: Not on file    Physically abused: Not on file    Forced sexual activity: Not on file  Other Topics Concern  . Not on file  Social History Narrative  . Not on file    Review of Systems: As per HPI, all others negative Physical Exam: Vital signs in last 24 hours: Temp:  [97.8 F (36.6 C)-98.5 F (36.9 C)] 98 F (36.7 C) (09/18 0614) Pulse Rate:  [57-99] 62 (09/18 0614) Resp:  [16-18] 16 (09/18 0614) BP: (97-123)/(58-82) 112/75 (09/18 8295) SpO2:  [98 %-100 %] 100 % (09/18 6213) Weight:  [88.5 kg] 88.5 kg (09/17 1046) Last BM Date: 07/03/18 General:   Alert, overweight, Well-developed, well-nourished, pleasant and cooperative in NAD Head:  Normocephalic and atraumatic. Eyes:  Sclera clear, no icterus.   Conjunctiva pink. Ears:  Normal auditory acuity. Nose:  No deformity, discharge,  or lesions. Mouth:  No deformity or lesions.  Oropharynx pink & moist. Neck:  Supple; no masses or thyromegaly. Lungs:  Clear throughout to  auscultation.   No wheezes, crackles, or rhonchi. No acute distress. Heart:  Regular rate and rhythm; no murmurs, clicks, rubs,  or gallops. Abdomen:  Soft, protuberant, nontender and nondistended. No masses, hepatosplenomegaly or hernias noted. Normal bowel sounds, without guarding, and without rebound.     Msk:  Symmetrical without gross deformities. Normal posture. Pulses:  Normal pulses noted. Extremities:  Without clubbing or edema. Neurologic:  Alert and  oriented x4;  grossly normal neurologically. Skin:  Intact without significant lesions  or rashes. Psych:  Alert and cooperative. Normal mood and affect.   Lab Results: Recent Labs    07/03/18 1127 07/03/18 2207 07/04/18 0454 07/04/18 0921  WBC 17.4*  --  10.7*  --   HGB 8.3* 9.1* 7.8* 8.1*  HCT 27.0* 28.6* 25.5* 26.1*  PLT 361  --  315  --    BMET Recent Labs    07/03/18 1605 07/04/18 0454  NA 142 141  K 3.9 4.4  CL 109 110  CO2 24 25  GLUCOSE 112* 104*  BUN 7 7  CREATININE 0.79 0.73  CALCIUM 7.6* 8.1*   LFT Recent Labs    07/03/18 1605  PROT 5.1*  ALBUMIN 1.9*  AST 28  ALT 31  ALKPHOS 47  BILITOT 0.3   PT/INR No results for input(s): LABPROT, INR in the last 72 hours.  Studies/Results: No results found.  Impression:  1.  Symptomatic anemia, dizziness/(pre)syncope.  Has prior anemia (unclear baseline Hgb), but suspect worsening anemia from cumulative blood loss from her ulcerative colitis. 2.  Blood in stool.  Mild.  Improving.  Suspect from colitis. 3.  Newly diagnosed ulcerative colitis.  Plan:  1.  IV steroids for another 24 hours, then transition to oral prednisone tomorrow. 2.  Parke SimmersBland diet, advance slowly as tolerated. 3.  Continue Pentasa. 4.  Blood transfusion. 5.  Hopefully home tomorrow with subsequent close outpatient follow-up with Dr. Evette CristalGanem. 6.  [facility] will follow.   LOS: 0 days   Akiem Urieta M  07/04/2018, 10:16 AM  Cell [phone] If no answer or after 5 PM call  [phone]

## 2018-07-04 NOTE — Progress Notes (Signed)
Pt eating and denies pain or discomfort.family at bedsdie

## 2018-07-04 NOTE — Evaluation (Signed)
Physical Therapy Evaluation Patient Details Name: Katrina Oconnor MRN: [id number] DOB: [birth year] Today's Date: 07/04/2018   History of Present Illness  Pt is a 47 y.o. F with significant PMH of anemia, newly diagnosed ulcerative colitis who presents after having syncopal episode. Today while taking her son to school had a syncopal episode with loss of consciousness lasting approximately 3 minutes.   Clinical Impression  Patient evaluated by Physical Therapy with no further acute PT needs identified. Patient is independent with all functional mobility and no balance deficits noted. Denies dizziness or lightheadedness.  All education has been completed and the patient has no further questions.No follow-up Physical Therapy or equipment needs. PT is signing off. Thank you for this referral.     Follow Up Recommendations No PT follow up    Equipment Recommendations  None recommended by PT    Recommendations for Other Services       Precautions / Restrictions Precautions Precautions: None Restrictions Weight Bearing Restrictions: No      Mobility  Bed Mobility Overal bed mobility: Independent                Transfers Overall transfer level: Independent                  Ambulation/Gait Ambulation/Gait assistance: Independent Gait Distance (Feet): 200 Feet Assistive device: None Gait Pattern/deviations: WFL(Within Functional Limits)     General Gait Details: Independent with ambulation, stepping over obstacles, and head turns  Stairs            Wheelchair Mobility    Modified Rankin (Stroke Patients Only)       Balance Overall balance assessment: Independent                                           Pertinent Vitals/Pain Pain Assessment: No/denies pain    Home Living Family/patient expects to be discharged to:: Private residence Living Arrangements: Spouse/significant other;Children(11 and 48 y.o. 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     Hand Dominance        Extremity/Trunk Assessment   Upper Extremity Assessment Upper Extremity Assessment: Overall WFL for tasks assessed    Lower Extremity Assessment Lower Extremity Assessment: Overall WFL for tasks assessed    Cervical / Trunk Assessment Cervical / Trunk Assessment: Normal  Communication   Communication: No difficulties  Cognition Arousal/Alertness: Awake/alert Behavior During Therapy: WFL for tasks assessed/performed Overall Cognitive Status: Within Functional Limits for tasks assessed                                        General Comments      Exercises     Assessment/Plan    PT Assessment Patent does not need any further PT services  PT Problem List         PT Treatment Interventions      PT Goals (Current goals can be found in the Care Plan section)  Acute Rehab PT Goals Patient Stated Goal: none stated, agreeable to participate in  therapy PT Goal Formulation: All assessment and education complete, DC therapy    Frequency     Barriers to discharge        Co-evaluation               AM-PAC PT "6 Clicks" Daily Activity  Outcome Measure Difficulty turning over in bed (including adjusting bedclothes, sheets and blankets)?: None Difficulty moving from lying on back to sitting on the side of the bed? : None Difficulty sitting down on and standing up from a chair with arms (e.g., wheelchair, bedside commode, etc,.)?: None Help needed moving to and from a bed to chair (including a wheelchair)?: None Help needed walking in hospital room?: None Help needed climbing 3-5 steps with a railing? : None 6 Click Score: 24    End of Session   Activity Tolerance: Patient  tolerated treatment well Patient left: in chair Nurse Communication: Mobility status PT Visit Diagnosis: Dizziness and giddiness (R42);Unsteadiness on feet (R26.81)    Time: 1610-9604 PT Time Calculation (min) (ACUTE ONLY): 8 min   Charges:   PT Evaluation $PT Eval Low Complexity: 1 Low          Laurina Bustle, PT, DPT Acute Rehabilitation Services Pager [phone] Office [phone]   Katrina Oconnor 07/04/2018, 10:38 AM

## 2018-07-05 DIAGNOSIS — K51011 Ulcerative (chronic) pancolitis with rectal bleeding: Principal | ICD-10-CM

## 2018-07-05 LAB — RENAL FUNCTION PANEL
Albumin: 2.2 g/dL — ABNORMAL LOW (ref 3.5–5.0)
Anion gap: 6 (ref 5–15)
BUN: 12 mg/dL (ref 6–20)
CO2: 26 mmol/L (ref 22–32)
Calcium: 8.4 mg/dL — ABNORMAL LOW (ref 8.9–10.3)
Chloride: 109 mmol/L (ref 98–111)
Creatinine, Ser: 0.85 mg/dL (ref 0.44–1.00)
GFR calc Af Amer: >60 mL/min (ref >60)
GFR calc non Af Amer: >60 mL/min (ref >60)
Glucose, Bld: 107 mg/dL — ABNORMAL HIGH (ref 70–99)
Phosphorus: 2.9 mg/dL (ref 2.5–4.6)
Potassium: 4.2 mmol/L (ref 3.5–5.1)
Sodium: 141 mmol/L (ref 135–145)

## 2018-07-05 LAB — CBC WITH DIFFERENTIAL/PLATELET
Abs Immature Granulocytes: 0.6 10*3/uL — ABNORMAL HIGH (ref 0.0–0.1)
Basophils Absolute: 0 10*3/uL (ref 0.0–0.1)
Basophils Relative: 0 %
Eosinophils Absolute: 0 10*3/uL (ref 0.0–0.7)
Eosinophils Relative: 0 %
HCT: 27.2 % — ABNORMAL LOW (ref 36.0–46.0)
Hemoglobin: 8.4 g/dL — ABNORMAL LOW (ref 12.0–15.0)
Immature Granulocytes: 4 %
Lymphocytes Relative: 11 %
Lymphs Abs: 1.9 10*3/uL (ref 0.7–4.0)
MCH: 25.8 pg — ABNORMAL LOW (ref 26.0–34.0)
MCHC: 30.9 g/dL (ref 30.0–36.0)
MCV: 83.7 fL (ref 78.0–100.0)
Monocytes Absolute: 0.6 10*3/uL (ref 0.1–1.0)
Monocytes Relative: 4 %
Neutro Abs: 14.2 10*3/uL — ABNORMAL HIGH (ref 1.7–7.7)
Neutrophils Relative %: 81 %
Platelets: 370 10*3/uL (ref 150–400)
RBC: 3.25 MIL/uL — ABNORMAL LOW (ref 3.87–5.11)
RDW: 18.1 % — ABNORMAL HIGH (ref 11.5–15.5)
WBC: 17.3 10*3/uL — ABNORMAL HIGH (ref 4.0–10.5)

## 2018-07-05 LAB — MAGNESIUM: Magnesium: 2 mg/dL (ref 1.7–2.4)

## 2018-07-05 MED ORDER — MESALAMINE ER 500 MG PO CPCR: 1000.0000 mg | ORAL_CAPSULE | Freq: Four times a day (QID) | ORAL | 0 refills | Status: DC

## 2018-07-05 MED ORDER — PREDNISONE 20 MG PO TABS: 40.0000 mg | ORAL_TABLET | Freq: Every day | ORAL | 0 refills | Status: DC

## 2018-07-05 MED ORDER — FERROUS SULFATE 325 (65 FE) MG PO TABS: 325.0000 mg | ORAL_TABLET | Freq: Every day | ORAL | 3 refills | Status: AC

## 2018-07-05 NOTE — Plan of Care (Signed)

## 2018-07-05 NOTE — Discharge Instructions (Signed)
Ulcerative Colitis, Adult Ulcerative colitis is long-lasting (chronic) swelling (inflammation) of the large intestine (colon). Sores (ulcers) may also form on the colon. Ulcerative colitis is closely related to another condition of inflammation of the intestines that is called Crohn disease. Together, they are frequently referred to as inflammatory bowel disease (IBD). What are the causes? Ulcerative colitis is caused by increased activity of the immune system in the intestines. The immune system is the system that protects the body against harmful bacteria, viruses, fungi, and other things that can make you sick. When the immune system overacts, it causes inflammation. The cause of the increased immune system activity is not known. What increases the risk? Risk factors of ulcerative colitis include:  Age. This includes: ? Being 15-30 years old. ? Being older than 48 years old.  Having a family history of ulcerative colitis.  Being of Jewish descent.  What are the signs or symptoms? Common symptoms of ulcerative colitis include rectal bleeding and diarrhea. There is a wide range of symptoms, and a person's symptoms depend on how severe the condition is. Additional symptoms may include:  Pain or cramping in the belly (abdomen).  Fever.  Fatigue.  Weight loss.  Night sweats.  Rectal pain.  Feeling the immediate need to have a bowel movement.  Nausea.  Loss of appetite.  Anemia.  Joint pain or soreness.  Eye irritation.  Certain skin rashes.  How is this diagnosed? Ulcerative colitis may be diagnosed by:  Medical history and physical exam.  Blood tests and stool tests.  X-rays.  CT scans.  Colonoscopy. For this test, a flexible tube is inserted into your anus and your colon is examined.  Examination of a tissue sample from your colon (biopsy).  How is this treated? Treatment for ulcerative colitis may include medicines to:  Decrease inflammation.  Control  your immune system.  Surgery may also be necessary. Follow these instructions at home: Medicines and vitamins  Take medicines only as directed by your doctor. Do not take aspirin.  Ask your doctor if you should take any vitamins or supplements. Lifestyle  Exercise regularly.  Limit alcohol intake to no more than 1 drink per day for nonpregnant women and 2 drinks per day for men. One drink equals 12 ounces of beer, 5 ounces of wine, or 1 ounces of hard liquor. Eating and drinking  Drink enough fluid to keep your urine clear or pale yellow.  Ask your health care provider about the best diet for you. Follow the diet as directed by your health care provider. This may include: ? Avoiding carbonated drinks. ? Avoiding popcorn, vegetable skins, nuts, and other high-fiber foods when you have symptoms of ulcerative colitis. ? Eating smaller meals more often. ? Keeping a food diary. This may help you to find and avoid any foods that make you feel not well.  Limit your caffeine intake. General instructions  Keep all follow-up appointments as directed by your health care provider. This is important. Contact a health care provider if:  Your symptoms do not improve or get worse with treatment.  You continue to lose weight.  You have constant cramps or loose bowels.  You develop a new skin rash, skin sores, or eye problems.  You have a fever or chills. Get help right away if:  You have bloody diarrhea.  You have severe pain in your abdomen.  You vomit. This information is not intended to replace advice given to you by your health care provider. Make sure   you discuss any questions you have with your health care provider. Document Released: 07/13/2005 Document Revised: 06/05/2016 Document Reviewed: 01/26/2015 Elsevier Interactive Patient Education  2018 Elsevier Inc.  

## 2018-07-05 NOTE — Progress Notes (Signed)
Subjective: No abdominal pain. No blood in stool.  Objective: Vital signs in last 24 hours: Temp:  [98 F (36.7 C)-98.4 F (36.9 C)] 98.2 F (36.8 C) (09/19 1443) Pulse Rate:  [81] 81 (09/19 0512) Resp:  [14-16] 16 (09/19 1443) BP: (102-115)/(58-65) 115/65 (09/19 1443) SpO2:  [100 %] 100 % (09/19 1443) Weight change:  Last BM Date: 07/05/18  PE: GEN:  NAD ABD:  Soft, non-tender  Lab Results: CBC    Component Value Date/Time   WBC 17.3 (H) 07/05/2018 0503   RBC 3.25 (L) 07/05/2018 0503   HGB 8.4 (L) 07/05/2018 0503   HCT 27.2 (L) 07/05/2018 0503   PLT 370 07/05/2018 0503   MCV 83.7 07/05/2018 0503   MCH 25.8 (L) 07/05/2018 0503   MCHC 30.9 07/05/2018 0503   RDW 18.1 (H) 07/05/2018 0503   LYMPHSABS 1.9 07/05/2018 0503   MONOABS 0.6 07/05/2018 0503   EOSABS 0.0 07/05/2018 0503   BASOSABS 0.0 07/05/2018 0503   CMP     Component Value Date/Time   NA 141 07/05/2018 0503   K 4.2 07/05/2018 0503   CL 109 07/05/2018 0503   CO2 26 07/05/2018 0503   GLUCOSE 107 (H) 07/05/2018 0503   BUN 12 07/05/2018 0503   CREATININE 0.85 07/05/2018 0503   CALCIUM 8.4 (L) 07/05/2018 0503   PROT 5.1 (L) 07/03/2018 1605   ALBUMIN 2.2 (L) 07/05/2018 0503   AST 28 07/03/2018 1605   ALT 31 07/03/2018 1605   ALKPHOS 47 07/03/2018 1605   BILITOT 0.3 07/03/2018 1605   GFRNONAA >60 07/05/2018 0503   GFRAA >60 07/05/2018 0503   Assessment:  1.  Symptomatic anemia, dizziness/(pre)syncope.  Has prior anemia (unclear baseline Hgb), but suspect worsening anemia from cumulative blood loss from her ulcerative colitis. 2.  Blood in stool.  Mild. Resolved.  Suspect from colitis. 3.  Newly diagnosed ulcerative colitis.  Plan:  1.  Advance diet. 2.  Prednisone 40 mg po qd. 3.  Pentasa 1000 mg po qid. 4.  OK to d/c home today. 5.  Patient can follow-up with Dr. Evette CristalGanem. 6.  Will sign-off; please call with questions; thank you for the consultation.   Freddy JakschOUTLAW,Lionel Woodberry M 07/05/2018, 4:19  PM   Cell [phone] If no answer or after 5 PM call [phone]

## 2018-07-05 NOTE — Progress Notes (Signed)
Katrina Oconnor to be D/C'd  per MD order. Discussed with the patient and all questions fully answered.  VSS, Skin clean, dry and intact without evidence of skin break down, no evidence of skin tears noted.  IV catheter discontinued intact. Site without signs and symptoms of complications. Dressing and pressure applied.  An After Visit Summary was printed and given to the patient. Patient received prescription.  D/c education completed with patient/family including follow up instructions, medication list, d/c activities limitations if indicated, with other d/c instructions as indicated by MD - patient able to verbalize understanding, all questions fully answered.   Patient instructed to return to ED, call 911, or call MD for any changes in condition.   Patient to be escorted via WC, and D/C home via private auto.

## 2018-07-05 NOTE — Progress Notes (Signed)
Progress Note  Patient Name: Katrina Oconnor Date of Encounter: 07/05/2018  Primary Cardiologist: No primary care provider on file.   Subjective   No chest pain or dyspnea. No dizziness. She feels great.   Inpatient Medications    Scheduled Meds: . feeding supplement  1 Container Oral BID BM  . mesalamine  1,000 mg Oral QID  . methylPREDNISolone (SOLU-MEDROL) injection  40 mg Intravenous Q12H  . pantoprazole  40 mg Oral Q1200   Continuous Infusions: . sodium chloride 75 mL/hr at 07/05/18 0400  . cefTRIAXone (ROCEPHIN)  IV Stopped (07/04/18 2121)   PRN Meds: acetaminophen **OR** acetaminophen, albuterol, ondansetron **OR** ondansetron (ZOFRAN) IV   Vital Signs    Vitals:   07/04/18 1302 07/04/18 1324 07/04/18 2145 07/05/18 0512  BP: 121/69 105/63 111/65 (!) 102/58  Pulse: 94 81 81 81  Resp: 18 20 16 14   Temp: 98.2 F (36.8 C) 98.2 F (36.8 C) 98.4 F (36.9 C) 98 F (36.7 C)  TempSrc:  Oral Oral Oral  SpO2: 98% 100% 100% 100%  Weight:      Height:        Intake/Output Summary (Last 24 hours) at 07/05/2018 0923 Last data filed at 07/05/2018 0400 Gross per 24 hour  Intake 1823.1 ml  Output -  Net 1823.1 ml   Filed Weights   07/03/18 1046  Weight: 88.5 kg    Telemetry    Sinus brady - Personally Reviewed  ECG    No AM EKG- Personally Reviewed  Physical Exam   GEN: No acute distress.   Neck: No JVD Cardiac: RRR, no murmurs, rubs, or gallops.  Respiratory: Clear to auscultation bilaterally. GI: Soft, nontender, non-distended  MS: No edema; No deformity. Neuro:  Nonfocal  Psych: Normal affect   Labs    Chemistry Recent Labs  Lab 07/03/18 1605 07/04/18 0454 07/05/18 0503  NA 142 141 141  K 3.9 4.4 4.2  CL 109 110 109  CO2 24 25 26   GLUCOSE 112* 104* 107*  BUN 7 7 12   CREATININE 0.79 0.73 0.85  CALCIUM 7.6* 8.1* 8.4*  PROT 5.1*  --   --   ALBUMIN 1.9*  --  2.2*  AST 28  --   --   ALT 31  --   --   ALKPHOS 47  --   --   BILITOT 0.3   --   --   GFRNONAA >60 >60 >60  GFRAA >60 >60 >60  ANIONGAP 9 6 6      Hematology Recent Labs  Lab 07/03/18 1127  07/04/18 0454 07/04/18 0921 07/05/18 0503  WBC 17.4*  --  10.7*  --  17.3*  RBC 3.35*  --  3.04*  --  3.25*  HGB 8.3*   < > 7.8* 8.1* 8.4*  HCT 27.0*   < > 25.5* 26.1* 27.2*  MCV 80.6  --  83.9  --  83.7  MCH 24.8*  --  25.7*  --  25.8*  MCHC 30.7  --  30.6  --  30.9  RDW 17.7*  --  17.6*  --  18.1*  PLT 361  --  315  --  370   < > = values in this interval not displayed.    Cardiac EnzymesNo results for input(s): TROPONINI in the last 168 hours. No results for input(s): TROPIPOC in the last 168 hours.   BNPNo results for input(s): BNP, PROBNP in the last 168 hours.   DDimer No results for input(s): DDIMER  in the last 168 hours.   Radiology    No results found.  Cardiac Studies     Patient Profile     47 y.o. female with UC and anemia, near syncope. Baseline bradycardia. Normal echo last week.   Assessment & Plan    1. Near syncope: Pt with episode of near syncope prior to admission. No loss of consciousness. Echo normal last week. No arrhythmias noted on telemetry. She has baseline sinus bradycardia but has no dizziness with this since she has been here. Her dizziness is likely related to her anemia. We will arrange a 30 day event monitor at the time of discharge. (this has been planned already and she will be notified)  [facility] will sign off.   Medication Recommendations:  No changes Other recommendations (labs, testing, etc):  We have a 30 day monitor arranged when discharged.  Follow up as an outpatient:  We will arrange follow up in our office to be completed when the monitor has been turned in after 30 days.    For questions or updates, please contact [facility] Please consult [email or web address] for contact info under        Signed, Verne Carrow, MD  07/05/2018, 9:23 AM

## 2018-07-05 NOTE — Discharge Summary (Signed)
[facility]  Physician Discharge Summary   Patient ID: Langley Gaussonya L Geesey MRN: [id number] DOB/AGE: 03-29-1971 47 y.o.  Admit date: 07/03/2018 Discharge date: 07/05/2018  PCP: Patient, No Pcp Per  DISCHARGE DIAGNOSES:  Ulcerative colitis Acute blood loss anemia Syncope most likely due to the above  RECOMMENDATIONS FOR OUTPATIENT FOLLOW UP: 1. Gastroenterology to arrange outpatient follow-up 2. Cardiology to arrange 30-day event monitor 3. Patient has been told that she will need blood work to check hemoglobin next week and check thyroid function tests in the next few weeks.   DISCHARGE CONDITION: fair  Diet recommendation: As before  Filed Weights   07/03/18 1046  Weight: 88.5 kg    INITIAL HISTORY: 47 y.o.femalewith medical history significant ofanemia and newly diagnosed ulcerative colitiswho presents after having to syncopal episodes.   Patient was experiencing rectal bleeding.  Cardiology and gastroenterology were consulted.  She was transfused PRBCs during this hospitalization.  Consultations:  [facility]  Procedures:  Blood transfusion PRBCs   HOSPITAL COURSE:   UlcerativeColitis with hematochezia Recently hospitalized with nausea vomiting and diarrhea.  Seen by gastroenterology and underwent colonoscopy which showed pancolitis.  Biopsies suggested chronic colitis.  She was given a diagnosis of ulcerative colitis.  She was started on mesalamine and steroids.  She came back to the hospital with complaints of rectal bleeding and syncope.  Gastroenterology was again consulted.  She was started on IV steroids.  Her bleeding has subsided.  Hemoglobin is stable with the blood transfusions.  Transition to oral steroids and cleared for discharge by gastroenterology.  She will be on mesalamine 1000mg  4 times a day and prednisone 40 mg once a day.  They will arrange outpatient follow-up.    Acute blood loss anemia with possibly a component of iron  deficiency as well Likely multifactorial.  She could have had slow GI loss from her colitis.  Unfortunately no anemia panel was done during last hospitalization over this hospitalization.  She was transfused PRBCs.  She will be discharged on iron tablets.  She will need to undergo blood work in the outpatient setting by PCP.      Syncope  Patient with 2 episodes of syncope once while driving and the second while having a bowel movement. Most likely vasovagal phenomenon.  No arrhythmia noted on telemetry.  Telemetry did show some sinus bradycardia.  Seen by cardiology.  30-day event monitor to be arranged by them.  TSH was 0.330 recently.  Free T4 0.96.  Recommend repeating thyroid function test as outpatient.  Abnormal thyroid function tests See above under syncope.  Recommend repeating TSH and free T4 in the next few weeks in the outpatient setting.  Leukocytosis WBC elevated was 17.4 on presentation.  Patient was on steroids which is the most likely explanation for her leukocytosis.  Abnormal UA/possible UTI Patient without any symptoms suggestive of UTI.  She was empirically given ceftriaxone.  No urine culture was sent.  No need to continue antibiotics.  GERD Continue protonix  Overall stable.  Okay for discharge home today.  Cleared by cardiology and gastroenterology.    PERTINENT LABS:  The results of significant diagnostics from this hospitalization (including imaging, microbiology, ancillary and laboratory) are listed below for reference.      Labs: Basic Metabolic Panel: Recent Labs  Lab 07/03/18 1605 07/04/18 0454 07/05/18 0503  NA 142 141 141  K 3.9 4.4 4.2  CL 109 110 109  CO2 24 25 26   GLUCOSE 112* 104* 107*  BUN 7 7  12  CREATININE 0.79 0.73 0.85  CALCIUM 7.6* 8.1* 8.4*  MG  --   --  2.0  PHOS  --   --  2.9   Liver Function Tests: Recent Labs  Lab 07/03/18 1605 07/05/18 0503  AST 28  --   ALT 31  --   ALKPHOS 47  --   BILITOT 0.3  --   PROT 5.1*   --   ALBUMIN 1.9* 2.2*   CBC: Recent Labs  Lab 07/03/18 1127 07/03/18 2207 07/04/18 0454 07/04/18 0921 07/05/18 0503  WBC 17.4*  --  10.7*  --  17.3*  NEUTROABS  --   --   --   --  14.2*  HGB 8.3* 9.1* 7.8* 8.1* 8.4*  HCT 27.0* 28.6* 25.5* 26.1* 27.2*  MCV 80.6  --  83.9  --  83.7  PLT 361  --  315  --  370     IMAGING STUDIES Ct Angio Chest Pe W And/or Wo Contrast  Result Date: 06/22/2018 CLINICAL DATA:  Tachycardia with right shoulder and chest pain. EXAM: CT ANGIOGRAPHY CHEST WITH CONTRAST TECHNIQUE: Multidetector CT imaging of the chest was performed using the standard protocol during bolus administration of intravenous contrast. Multiplanar CT image reconstructions and MIPs were obtained to evaluate the vascular anatomy. CONTRAST:  ISOVUE-370 IOPAMIDOL (ISOVUE-370) INJECTION 76% COMPARISON:  None. FINDINGS: Cardiovascular: Motion artifact limits examination. Good opacification of the central and segmental pulmonary arteries. No focal filling defects. No evidence of significant pulmonary embolus. Normal heart size. No pericardial effusion. Normal caliber thoracic aorta. No dissection. Great vessel origins are patent. Mild aortic calcification. Mediastinum/Nodes: Esophagus is decompressed. No significant lymphadenopathy in the chest. Thyroid gland is unremarkable. Lungs/Pleura: Motion artifact limits evaluation. No airspace disease or consolidation is appreciated. No pleural effusions. No pneumothorax. Airways are patent. Upper Abdomen: No acute abnormality. Musculoskeletal: No chest wall abnormality. No acute or significant osseous findings. Review of the MIP images confirms the above findings. IMPRESSION: No evidence of significant pulmonary embolus. No evidence of active pulmonary disease. Electronically Signed   By: Burman Nieves M.D.   On: 06/22/2018 01:27   US Abdomen Limited  Result Date: 06/21/2018 CLINICAL DATA:  Initial evaluation for acute abdominal pain with  right-sided chest pain, nausea, vomiting. EXAM: ULTRASOUND ABDOMEN LIMITED RIGHT UPPER QUADRANT COMPARISON:  None. FINDINGS: Gallbladder: No gallstones or wall thickening visualized. No sonographic Murphy sign noted by sonographer. Common bile duct: Diameter: 3.4 mm Liver: No focal lesion identified. Within normal limits in parenchymal echogenicity. Portal vein is patent on color Doppler imaging with normal direction of blood flow towards the liver. IMPRESSION: Normal right upper quadrant ultrasound. Electronically Signed   By: Rise Mu M.D.   On: 06/21/2018 23:41   Dg Abd Acute W/chest  Result Date: 06/21/2018 CLINICAL DATA:  Initial evaluation for acute abdominal pain, right chest pain, nausea, vomiting. EXAM: DG ABDOMEN ACUTE W/ 1V CHEST COMPARISON:  Prior radiograph from 12/11/2013. FINDINGS: Cardiac and mediastinal silhouettes within normal limits. Mild aortic atherosclerosis. Lungs normally inflated. Minimal bibasilar subsegmental atelectatic changes. No focal infiltrates. No pulmonary edema or pleural effusion. No pneumothorax. Bowel gas pattern within normal limits without obstruction or ileus. No abnormal bowel wall thickening. No free air. No soft tissue mass or abnormal calcification. Visualized osseous structures within normal limits. IMPRESSION: 1. Nonobstructive bowel gas pattern with no radiographic evidence for acute intra-abdominal process. 2. Mild bibasilar subsegmental atelectasis. No other active cardiopulmonary disease. 3. Mild aortic atherosclerosis. Electronically Signed   By: Sharlet Salina  Phill Myron M.D.   On: 06/21/2018 23:53    DISCHARGE EXAMINATION: Vitals:   07/04/18 1302 07/04/18 1324 07/04/18 2145 07/05/18 0512  BP: 121/69 105/63 111/65 (!) 102/58  Pulse: 94 81 81 81  Resp: 18 20 16 14   Temp: 98.2 F (36.8 C) 98.2 F (36.8 C) 98.4 F (36.9 C) 98 F (36.7 C)  TempSrc:  Oral Oral Oral  SpO2: 98% 100% 100% 100%  Weight:      Height:       General appearance:  alert, cooperative, appears stated age and no distress Head: Normocephalic, without obvious abnormality, atraumatic Resp: clear to auscultation bilaterally Cardio: regular rate and rhythm, S1, S2 normal, no murmur, click, rub or gallop GI: soft, non-tender; bowel sounds normal; no masses,  no organomegaly  DISPOSITION: Home  Discharge Instructions    Call MD for:  difficulty breathing, headache or visual disturbances   Complete by:  As directed    Call MD for:  extreme fatigue   Complete by:  As directed    Call MD for:  persistant dizziness or light-headedness   Complete by:  As directed    Call MD for:  persistant nausea and vomiting   Complete by:  As directed    Call MD for:  severe uncontrolled pain   Complete by:  As directed    Call MD for:  temperature >100.4   Complete by:  As directed    Diet general   Complete by:  As directed    Discharge instructions   Complete by:  As directed    Get up slowly from a sitting or lying position in order to avoid getting dizzy or lightheaded.  Call Dr. Luan Moore office for any further issues with diarrhea or blood in the stool.  Take your medications as prescribed.  You will need to follow-up with your primary care physician's office next week to undergo blood work to make sure your hemoglobin is stable.  You were cared for by a hospitalist during your hospital stay. If you have any questions about your discharge medications or the care you received while you were in the hospital after you are discharged, you can call the unit and asked to speak with the hospitalist on call if the hospitalist that took care of you is not available. Once you are discharged, your primary care physician will handle any further medical issues. Please note that NO REFILLS for any discharge medications will be authorized once you are discharged, as it is imperative that you return to your primary care physician (or establish a relationship with a primary care physician  if you do not have one) for your aftercare needs so that they can reassess your need for medications and monitor your lab values. If you do not have a primary care physician, you can call [phone] for a physician referral.   Increase activity slowly   Complete by:  As directed         Allergies as of 07/05/2018      Reactions   Penicillins Hives      Medication List    STOP taking these medications   cyclobenzaprine 5 MG tablet Commonly known as:  FLEXERIL     TAKE these medications   ferrous sulfate 325 (65 FE) MG tablet Take 1 tablet (325 mg total) by mouth daily with breakfast.   mesalamine 500 MG CR capsule Commonly known as:  PENTASA Take 2 capsules (1,000 mg total) by mouth 4 (four) times daily. What  changed:  medication strength   ondansetron 4 MG tablet Commonly known as:  ZOFRAN Take 1 tablet (4 mg total) by mouth every 8 (eight) hours as needed for nausea or vomiting.   pantoprazole 40 MG tablet Commonly known as:  PROTONIX Take 1 tablet (40 mg total) by mouth daily at 12 noon.   predniSONE 20 MG tablet Commonly known as:  DELTASONE Take 2 tablets (40 mg total) by mouth daily with breakfast.        Follow-up Information    [facility] [employer]. Go on 07/11/2018.   Specialty:  Cardiology Why:  @2pm  for 30 days event monitor set up at Dr. Gibson Ramp office  Contact information: [address], [address] [place] [address] [phone]       Dyann Kief, [place]. Go on 08/21/2018.   Specialty:  Cardiology Why:  @9 :30am for cardiology follow up with Dr. Gibson Ramp PA Contact information: [address] [place] [address] [phone]        Graylin Shiver, MD Follow up.   Specialty:  Gastroenterology Why:  His office will arrange outpatient follow-up Contact information: [address] [place] [address] [phone]           TOTAL DISCHARGE TIME: 35 mins  Osvaldo Shipper  [facility] Pager [phone]  07/05/2018, 12:04 PM

## 2018-07-11 ENCOUNTER — Ambulatory Visit (INDEPENDENT_AMBULATORY_CARE_PROVIDER_SITE_OTHER): Payer: Managed Care, Other (non HMO)

## 2018-07-11 DIAGNOSIS — R55 Syncope and collapse: Secondary | ICD-10-CM | POA: Diagnosis not present

## 2018-07-18 ENCOUNTER — Ambulatory Visit: Payer: Self-pay | Admitting: Nurse Practitioner

## 2018-08-20 ENCOUNTER — Telehealth: Payer: Self-pay

## 2018-08-20 DIAGNOSIS — R001 Bradycardia, unspecified: Secondary | ICD-10-CM | POA: Insufficient documentation

## 2018-08-20 NOTE — Progress Notes (Deleted)
Cardiology Office Note    Date:  08/20/2018   ID:  Katrina Oconnor, DOB 09-Jan-1971, MRN [id number]  PCP:  Dorothyann Peng, MD  Cardiologist: Verne Carrow, MD EPS: None  No chief complaint on file.   History of Present Illness:  Katrina Oconnor is a 47 y.o. female with history of ulcerative colitis and anemia who we saw in the hospital for near syncope but no loss of consciousness with baseline bradycardia.  This was in the setting of hypotension, hemoglobin 8.3 status post transfusion and IV Solu-Medrol for bleeding from ulcerative colitis.  2D echo which was normal.  No arrhythmias on telemetry.  Was felt her dizziness was most likely related to her anemia.  30-day monitor showed normal sinus rhythm with 4 beat run of ventricular tachycardia versus artifact.  Dr. Clifton James reviewed and did not recommend any further changes.    Past Medical History:  Diagnosis Date  . Arthritis    both legs sciatic nerve pain at times  . Blood in stool    since april 2015, found microscopic  . GERD (gastroesophageal reflux disease)     Past Surgical History:  Procedure Laterality Date  . BIOPSY  06/26/2018   Procedure: BIOPSY;  Surgeon: Graylin Shiver, MD;  Location: [facility] ENDOSCOPY;  Service: Endoscopy;;  . CESAREAN SECTION     x 2  . COLONOSCOPY WITH PROPOFOL N/A 05/06/2014   Procedure: COLONOSCOPY WITH PROPOFOL;  Surgeon: Charolett Bumpers, MD;  Location: [facility] ENDOSCOPY;  Service: Endoscopy;  Laterality: N/A;  . COLONOSCOPY WITH PROPOFOL N/A 06/26/2018   Procedure: COLONOSCOPY WITH PROPOFOL;  Surgeon: Graylin Shiver, MD;  Location: [facility] ENDOSCOPY;  Service: Endoscopy;  Laterality: N/A;  . ESOPHAGOGASTRODUODENOSCOPY (EGD) WITH PROPOFOL N/A 05/06/2014   Procedure: ESOPHAGOGASTRODUODENOSCOPY (EGD) WITH PROPOFOL;  Surgeon: Charolett Bumpers, MD;  Location: [facility] ENDOSCOPY;  Service: Endoscopy;  Laterality: N/A;    Current Medications: No outpatient medications have been marked as taking for the 08/21/18  encounter (Appointment) with Dyann Kief, PA-C.     Allergies:   Penicillins   Social History   Socioeconomic History  . Marital status: Married    Spouse name: Not on file  . Number of children: Not on file  . Years of education: Not on file  . Highest education level: Not on file  Occupational History  . Not on file  Social Needs  . Financial resource strain: Not on file  . Food insecurity:    Worry: Not on file    Inability: Not on file  . Transportation needs:    Medical: Not on file    Non-medical: Not on file  Tobacco Use  . Smoking status: Never Smoker  . Smokeless tobacco: Never Used  Substance and Sexual Activity  . Alcohol use: No  . Drug use: No  . Sexual activity: Not on file  Lifestyle  . Physical activity:    Days per week: Not on file    Minutes per session: Not on file  . Stress: Not on file  Relationships  . Social connections:    Talks on phone: Not on file    Gets together: Not on file    Attends religious service: Not on file    Active member of club or organization: Not on file    Attends meetings of clubs or organizations: Not on file    Relationship status: Not on file  Other Topics Concern  . Not on file  Social History Narrative  .  Not on file     Family History:  The patient's ***family history is not on file.   ROS:   Please see the history of present illness.    ROS All other systems reviewed and are negative.   PHYSICAL EXAM:   VS:  There were no vitals taken for this visit.  Physical Exam  GEN: Well nourished, well developed, in no acute distress  HEENT: normal  Neck: no JVD, carotid bruits, or masses Cardiac:RRR; no murmurs, rubs, or gallops  Respiratory:  clear to auscultation bilaterally, normal work of breathing GI: soft, nontender, nondistended, + BS Ext: without cyanosis, clubbing, or edema, Good distal pulses bilaterally MS: no deformity or atrophy  Skin: warm and dry, no rash Neuro:  Alert and Oriented x 3,  Strength and sensation are intact Psych: euthymic mood, full affect  Wt Readings from Last 3 Encounters:  07/03/18 195 lb 1.7 oz (88.5 kg)  06/26/18 182 lb (82.6 kg)  03/24/15 197 lb 6.4 oz (89.5 kg)      Studies/Labs Reviewed:   EKG:  EKG is*** ordered today.  The ekg ordered today demonstrates ***  Recent Labs: 06/22/2018: TSH 0.330 07/03/2018: ALT 31 07/05/2018: BUN 12; Creatinine, Ser 0.85; Hemoglobin 8.4; Magnesium 2.0; Platelets 370; Potassium 4.2; Sodium 141   Lipid Panel No results found for: CHOL, TRIG, HDL, CHOLHDL, VLDL, LDLCALC, LDLDIRECT  Additional studies/ records that were reviewed today include:   Echocardiogram 06/25/2018 Study Conclusions   - Left ventricle: The cavity size was normal. Systolic function was   normal. The estimated ejection fraction was in the range of 60%   to 65%. Wall motion was normal; there were no regional wall   motion abnormalities. Doppler parameters are consistent with   abnormal left ventricular relaxation (grade 1 diastolic   dysfunction). - Aortic valve: Transvalvular velocity was within the normal range.   There was no stenosis. There was no regurgitation. - Mitral valve: There was trivial regurgitation. - Left atrium: The atrium was moderately dilated. - Right ventricle: The cavity size was normal. Wall thickness was   normal. Systolic function was normal. - Tricuspid valve: There was trivial regurgitation. - Pulmonary arteries: Systolic pressure was mildly increased. PA   peak pressure: 42 mm Hg (S).   30-day monitor 07/11/2018 Sinus rhythm with sinus bradycardia.  Rare premature ventricular contractions with one 4 beat run ventricular tachycardia.    Dr. Clifton James reviewed and said 4 beat ventricular tachycardia versus artifact   ASSESSMENT:    1. Syncope, vasovagal   2. Sinus bradycardia      PLAN:  In order of problems listed above:  Vasovagal syncope/near syncope in the setting of ulcerative colitis hemoglobin  of 8.3 and hypotension treated with transfusion and IV Solu-Medrol.  Patient has baseline sinus bradycardia.  Normal LV function on 2D echo and 30-day monitor showed normal sinus rhythm with 1 4 beat run of ventricular tachycardia versus artifact.  Reviewed by Dr. Clifton James who does not recommend any further changes.  Sinus bradycardia at baseline asymptomatic    Medication Adjustments/Labs and Tests Ordered: Current medicines are reviewed at length with the patient today.  Concerns regarding medicines are outlined above.  Medication changes, Labs and Tests ordered today are listed in the Patient Instructions below. There are no Patient Instructions on file for this visit.   Elson Clan, PA-C  08/20/2018 3:47 PM    [facility] [address] [place], [place], [place]  [address] Phone: [phone]; Fax: [phone]    

## 2018-08-20 NOTE — Telephone Encounter (Signed)
Notes recorded by Sigurd Sos, RN on 08/20/2018 at 9:04 AM EST [id number] 11/4 ------

## 2018-08-20 NOTE — Telephone Encounter (Signed)
-----   Message from Kathleene Hazel, MD sent at 08/20/2018  8:54 AM EST ----- Her monitor shows sinus rhythm. There was a 4 beat run of ventricular tachycardia vs artifact. Nothing to change. Can we let her know? Thanks, chris

## 2018-08-21 ENCOUNTER — Ambulatory Visit: Payer: Managed Care, Other (non HMO) | Admitting: Physician Assistant

## 2018-08-21 DIAGNOSIS — R0989 Other specified symptoms and signs involving the circulatory and respiratory systems: Secondary | ICD-10-CM

## 2018-09-10 ENCOUNTER — Encounter: Payer: Self-pay | Admitting: *Deleted

## 2020-04-27 IMAGING — CT CT ANGIO CHEST
2 of 6 series · 19 of 36 positions shown · IV contrast (iopamidol)
Comparison: None.

CLINICAL DATA: Tachycardia with right shoulder and chest pain.

EXAM:
CT ANGIOGRAPHY CHEST WITH CONTRAST
TECHNIQUE: Multidetector CT imaging of the chest was performed using the
standard protocol during bolus administration of intravenous
contrast. Multiplanar CT image reconstructions and MIPs were
obtained to evaluate the vascular anatomy.
CONTRAST:  100mL HKIWVF-ICQ IOPAMIDOL (HKIWVF-ICQ) INJECTION 76%

[Series 7: pe thins · axial · 0.71mm/px · z∈[+1082,+1289]mm · 18 of 330 slices shown]
[im 17/330  lung]
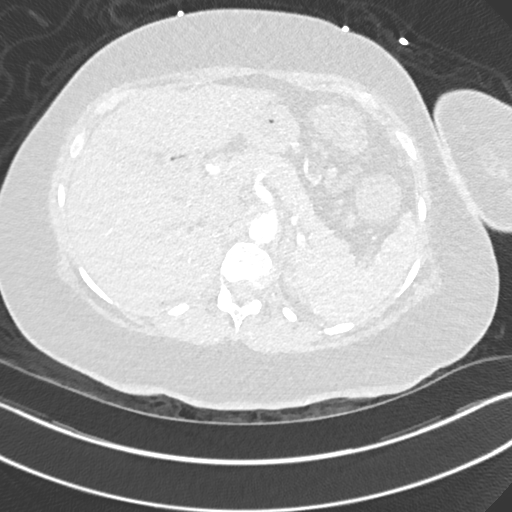
[im 33/330  mediastinal]
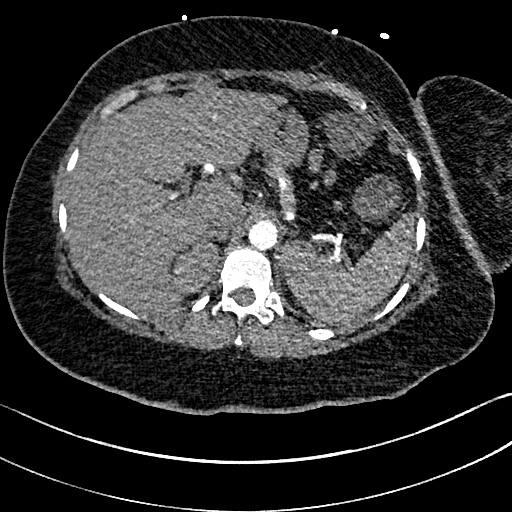
[im 50/330  lung]
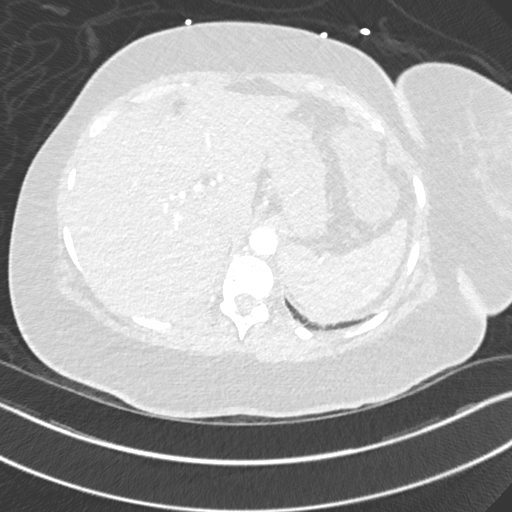
[im 66/330  mediastinal]
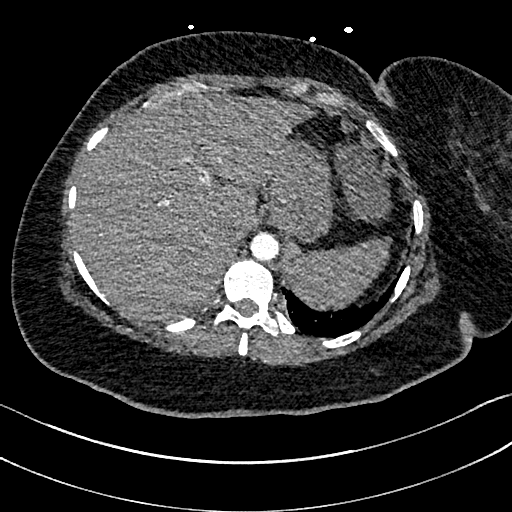
[im 83/330  lung]
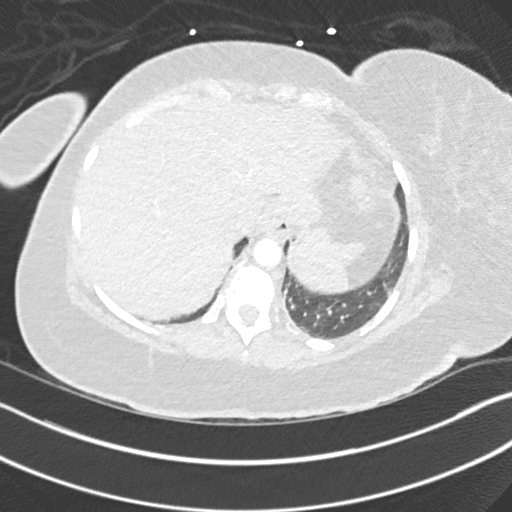
[im 99/330  mediastinal]
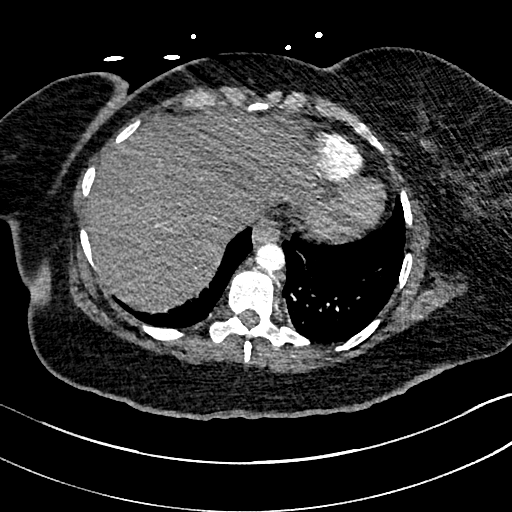
[im 116/330  lung]
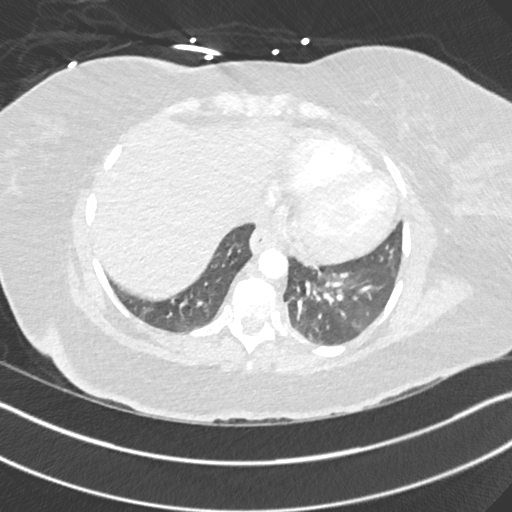
[im 132/330  mediastinal]
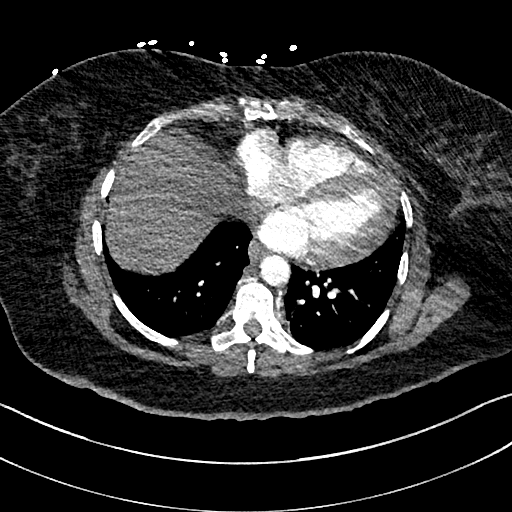
[im 149/330  lung]
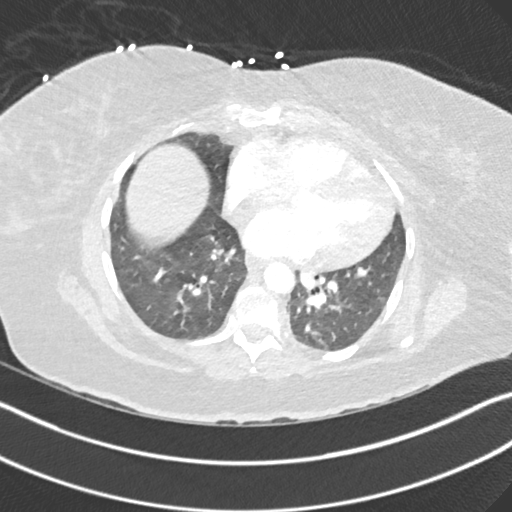
[im 181/330  mediastinal]
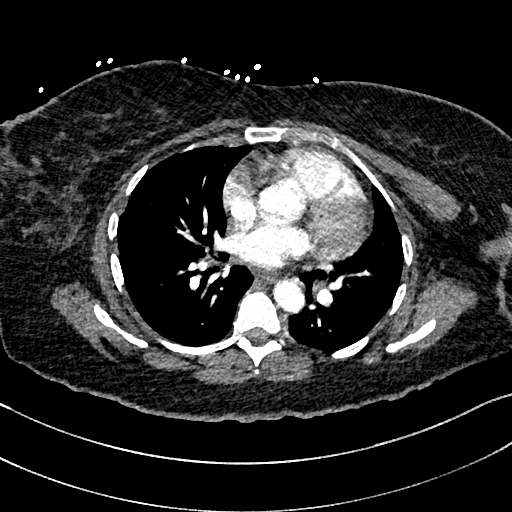
[im 198/330  lung]
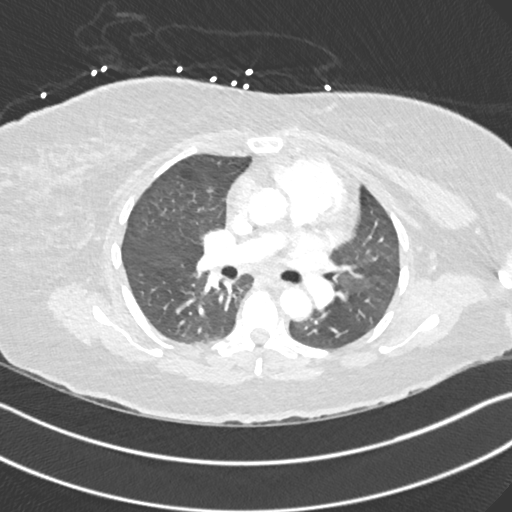
[im 214/330  mediastinal]
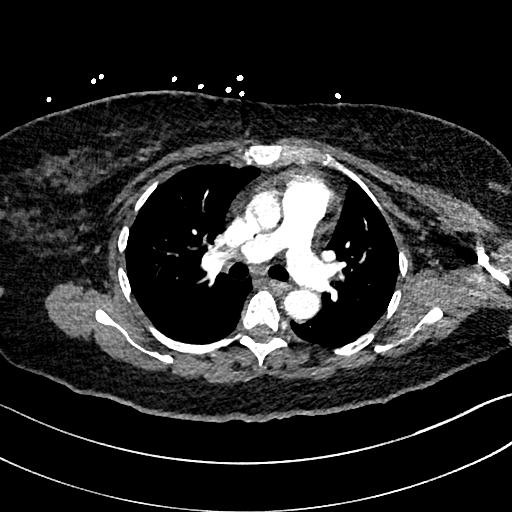
[im 231/330  lung]
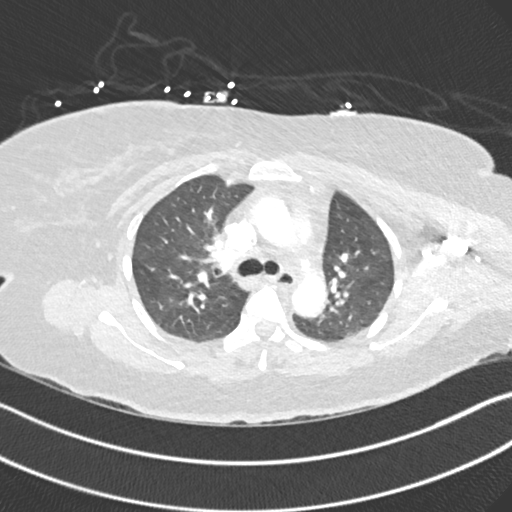
[im 247/330  mediastinal]
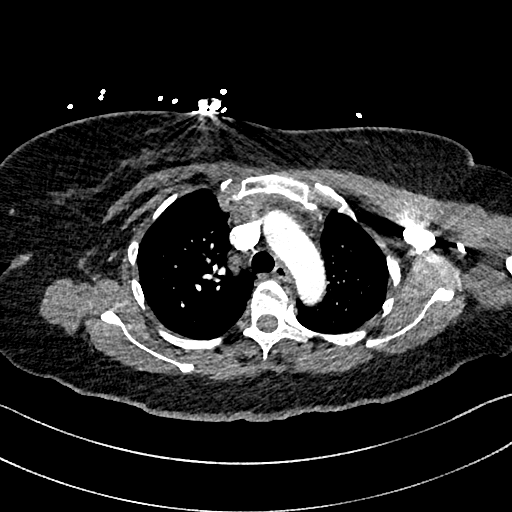
[im 264/330  lung]
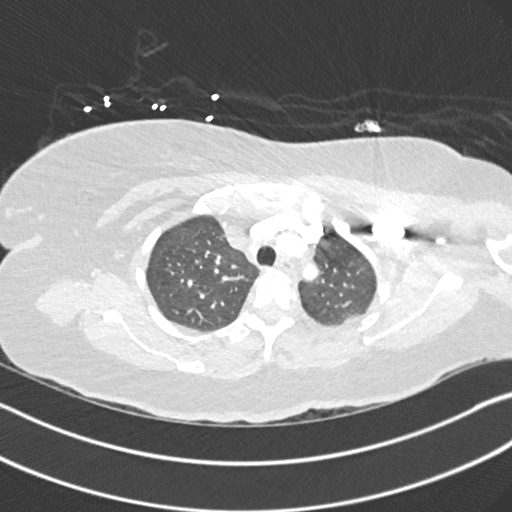
[im 280/330  mediastinal]
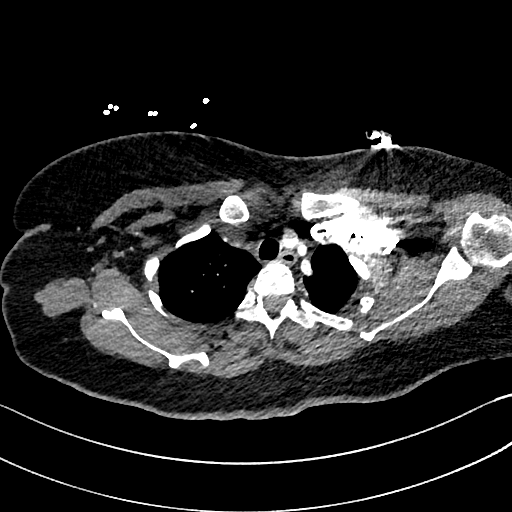
[im 297/330  lung]
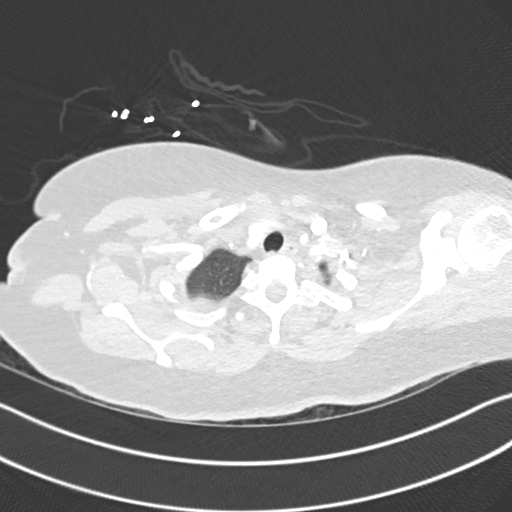
[im 313/330  mediastinal]
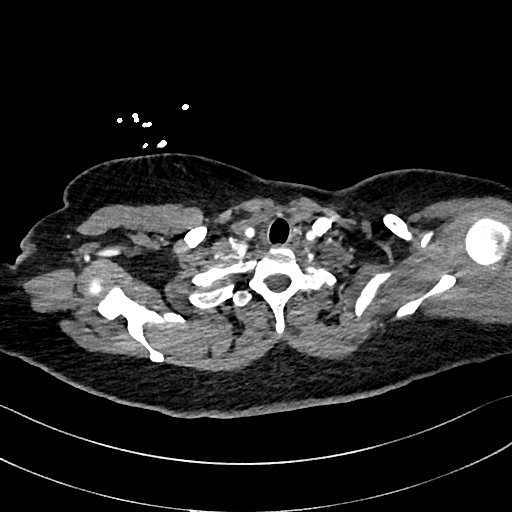

[Series 8: pe 2mm cor · coronal · 0.59mm/px · 1 of 151 slices shown]
[im 76/151  mediastinal]
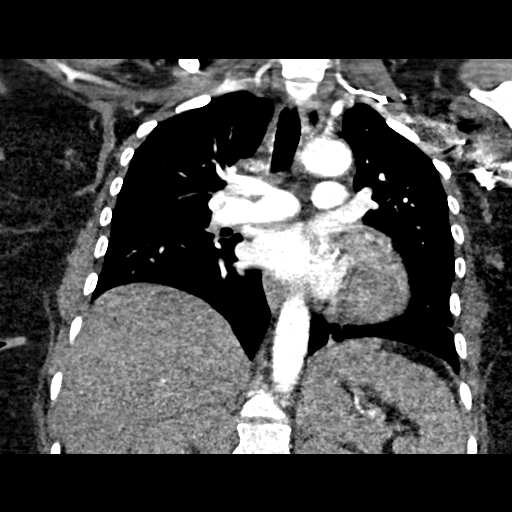

[19 of 36 positions shown; findings below may reference images not displayed]

FINDINGS: Cardiovascular: Motion artifact limits examination. Good
opacification of the central and segmental pulmonary arteries. No
focal filling defects. No evidence of significant pulmonary embolus.
Normal heart size. No pericardial effusion. Normal caliber thoracic
aorta. No dissection. Great vessel origins are patent. Mild aortic
calcification.

Mediastinum/Nodes: Esophagus is decompressed. No significant
lymphadenopathy in the chest. Thyroid gland is unremarkable.

Lungs/Pleura: Motion artifact limits evaluation. No airspace disease
or consolidation is appreciated. No pleural effusions. No
pneumothorax. Airways are patent.

Upper Abdomen: No acute abnormality.

Musculoskeletal: No chest wall abnormality. No acute or significant
osseous findings.

Review of the MIP images confirms the above findings.
IMPRESSION: No evidence of significant pulmonary embolus. No evidence of active
pulmonary disease.

## 2020-11-26 LAB — HM COLONOSCOPY

## 2020-12-15 ENCOUNTER — Encounter: Payer: Self-pay | Admitting: Internal Medicine

## 2022-01-09 ENCOUNTER — Encounter (HOSPITAL_COMMUNITY): Payer: Self-pay

## 2022-01-09 ENCOUNTER — Other Ambulatory Visit: Payer: Self-pay

## 2022-01-09 ENCOUNTER — Ambulatory Visit (HOSPITAL_COMMUNITY)
Admission: EM | Admit: 2022-01-09 | Discharge: 2022-01-09 | Disposition: A | Discharge status: Home / Self Care | Payer: Managed Care, Other (non HMO) | Attending: Nurse Practitioner | Admitting: Nurse Practitioner

## 2022-01-09 DIAGNOSIS — M546 Pain in thoracic spine: Secondary | ICD-10-CM | POA: Diagnosis not present

## 2022-01-09 MED ORDER — TIZANIDINE HCL 4 MG PO TABS: 4.0000 mg | ORAL_TABLET | Freq: Every evening | ORAL | 0 refills | Status: AC | PRN

## 2022-01-09 NOTE — ED Triage Notes (Signed)
Pt presents with c/o back pain that is causing her to barely walk.  ? ?Pt states it has been going on for 1 week.  ?

## 2022-01-09 NOTE — ED Provider Notes (Signed)
?[facility] ? ? ? ?CSN: [id number] ?Arrival date & time: 01/09/22  1718 ? ? ?  ? ?History   ?Chief Complaint ?Chief Complaint  ?Patient presents with  ? Back Pain  ? ? ?HPI ?Katrina Oconnor is a 51 y.o. female.  ? ?Patient presents with bilateral back pain for the past week.  Patient denies any radiation down her arms to her fingertips or down her legs to her toes.  She reports the pain is worse with walking.  She has tried Tylenol with relief of pain until it wears off.  She denies any dysuria, urinary frequency or urgency.  She is drinking plenty of fluids. ? ?Patient also reports a history of ulcerative colitis.  She reports she is currently in a flare.  She reports this is her first flare in a number of years and thinks it is almost resolved.  She wonders if the ulcerative colitis flare could have caused the back pain. ? ? ?Past Medical History:  ?Diagnosis Date  ? Arthritis   ? both legs sciatic nerve pain at times  ? Blood in stool   ? since april 2015, found microscopic  ? GERD (gastroesophageal reflux disease)   ? ? ?Patient Active Problem List  ? Diagnosis Date Noted  ? Sinus bradycardia 08/20/2018  ? Syncope 07/04/2018  ? Hematochezia 07/04/2018  ? Acute blood loss anemia 07/03/2018  ? Abnormal urinalysis 07/03/2018  ? Leukocytosis 07/03/2018  ? Syncope, vasovagal 07/03/2018  ? Ulcerative colitis with rectal bleeding ([place]) 06/25/2018  ? Sinus tachycardia 06/25/2018  ? Nausea vomiting and diarrhea 06/22/2018  ? Gastroenteritis 06/22/2018  ? ? ?Past Surgical History:  ?Procedure Laterality Date  ? BIOPSY  06/26/2018  ? Procedure: BIOPSY;  Surgeon: Wonda Horner, MD;  Location: [facility] ENDOSCOPY;  Service: Endoscopy;;  ? CESAREAN SECTION    ? x 2  ? COLONOSCOPY WITH PROPOFOL N/A 05/06/2014  ? Procedure: COLONOSCOPY WITH PROPOFOL;  Surgeon: Garlan Fair, MD;  Location: [facility] ENDOSCOPY;  Service: Endoscopy;  Laterality: N/A;  ? COLONOSCOPY WITH PROPOFOL N/A 06/26/2018  ? Procedure: COLONOSCOPY WITH PROPOFOL;   Surgeon: Wonda Horner, MD;  Location: [facility] ENDOSCOPY;  Service: Endoscopy;  Laterality: N/A;  ? ESOPHAGOGASTRODUODENOSCOPY (EGD) WITH PROPOFOL N/A 05/06/2014  ? Procedure: ESOPHAGOGASTRODUODENOSCOPY (EGD) WITH PROPOFOL;  Surgeon: Garlan Fair, MD;  Location: [facility] ENDOSCOPY;  Service: Endoscopy;  Laterality: N/A;  ? ? ?OB History   ?No obstetric history on file. ?  ? ? ? ?Home Medications   ? ?Prior to Admission medications   ?Medication Sig Start Date End Date Taking? Authorizing Provider  ?tiZANidine (ZANAFLEX) 4 MG tablet Take 1 tablet (4 mg total) by mouth at bedtime as needed for muscle spasms. Do not take while driving or operating heavy machinery 01/09/22  Yes Eulogio Bear, NP  ?ferrous sulfate (FERROUSUL) 325 (65 FE) MG tablet Take 1 tablet (325 mg total) by mouth daily with breakfast. 07/05/18   Bonnielee Haff, MD  ? ? ?Family History ?History reviewed. No pertinent family history. ? ?Social History ?Social History  ? ?Tobacco Use  ? Smoking status: Never  ? Smokeless tobacco: Never  ?Substance Use Topics  ? Alcohol use: No  ? Drug use: No  ? ? ? ?Allergies   ?Penicillins ? ? ?Review of Systems ?Review of Systems ?Per HPI ? ?Physical Exam ?Triage Vital Signs ?ED Triage Vitals [01/09/22 1803]  ?Enc Vitals Group  ?   BP (!) 143/84  ?   Pulse Rate Marland Kitchen)  108  ?   Resp 18  ?   Temp   ?   Temp src   ?   SpO2 98 %  ?   Weight   ?   Height   ?   Head Circumference   ?   Peak Flow   ?   Pain Score 10  ?   Pain Loc   ?   Pain Edu?   ?   Excl. in GC?   ? ?No data found. ? ?Updated Vital Signs ?BP (!) 143/84 (BP Location: Right Arm)   Pulse (!) 108   Resp 18   SpO2 98%  ? ?Visual Acuity ?Right Eye Distance:   ?Left Eye Distance:   ?Bilateral Distance:   ? ?Right Eye Near:   ?Left Eye Near:    ?Bilateral Near:    ? ?Physical Exam ?Vitals and nursing note reviewed.  ?Constitutional:   ?   General: She is not in acute distress. ?   Appearance: Normal appearance. She is not ill-appearing, toxic-appearing or  diaphoretic.  ?HENT:  ?   Head: Normocephalic and atraumatic.  ?Cardiovascular:  ?   Rate and Rhythm: Normal rate and regular rhythm.  ?Pulmonary:  ?   Effort: Pulmonary effort is normal. No respiratory distress.  ?   Breath sounds: Normal breath sounds. No wheezing, rhonchi or rales.  ?Musculoskeletal:     ?   General: Normal range of motion.  ?     Arms: ? ?   Comments: Pain in areas marked; nontender to palpation.  No obvious swelling, deformity, warmth appreciated  ?Skin: ?   General: Skin is warm and dry.  ?   Coloration: Skin is not jaundiced or pale.  ?   Findings: No erythema.  ?Neurological:  ?   Mental Status: She is alert and oriented to person, place, and time.  ?   Motor: No weakness.  ?   Gait: Gait normal.  ?Psychiatric:     ?   Mood and Affect: Mood normal.     ?   Behavior: Behavior normal.  ? ? ? ?UC Treatments / Results  ?Labs ?(all labs ordered are listed, but only abnormal results are displayed) ?Labs Reviewed - No data to display ? ?EKG ? ? ?Radiology ?No results found. ? ?Procedures ?Procedures (including critical care time) ? ?Medications Ordered in UC ?Medications - No data to display ? ?Initial Impression / Assessment and Plan / UC Course  ?I have reviewed the triage vital signs and the nursing notes. ? ?Pertinent labs & imaging results that were available during my care of the patient were reviewed by me and considered in my medical decision making (see chart for details). ? ?  ?Suspect muscular pain; question if this may be related to recent ulcerative colitis flare.  Treat with continued use of Tylenol every 4-6 hours as needed for pain.  She can also use muscle relaxant at nighttime to help relax skeletal muscles.  She is nontoxic and well-appearing today.  If symptoms persist or worsen, seek care. ? ?I also encouraged her to follow-up with a gastroenterologist regarding the underlying ulcerative colitis.  Number provided on her checkout paperwork. ?Final Clinical Impressions(s) / UC  Diagnoses  ? ?Final diagnoses:  ?Acute bilateral thoracic back pain  ? ? ? ?Discharge Instructions   ? ?  ?- You can continue taking Tylenol 500 mg every 6 hours as needed for pain ?- You can also start the muscle relaxant to help  relax your muscles.  Try to take it at night time as it can cause drowsiness.  ? ? ? ?ED Prescriptions   ? ? Medication Sig Dispense Auth. Provider  ? tiZANidine (ZANAFLEX) 4 MG tablet Take 1 tablet (4 mg total) by mouth at bedtime as needed for muscle spasms. Do not take while driving or operating heavy machinery 30 tablet Eulogio Bear, NP  ? ?  ? ?PDMP not reviewed this encounter. ?  ?Eulogio Bear, NP ?01/09/22 1900 ? ?

## 2022-01-09 NOTE — Discharge Instructions (Addendum)
-   You can continue taking Tylenol 500 mg every 6 hours as needed for pain ?- You can also start the muscle relaxant to help relax your muscles.  Try to take it at night time as it can cause drowsiness.  ?

## 2022-10-06 ENCOUNTER — Ambulatory Visit: Payer: Managed Care, Other (non HMO) | Admitting: Cardiology

## 2022-10-21 ENCOUNTER — Encounter: Payer: Self-pay | Admitting: Cardiology

## 2023-12-15 ENCOUNTER — Ambulatory Visit: Payer: Managed Care, Other (non HMO) | Admitting: Family Medicine

## 2024-02-16 ENCOUNTER — Ambulatory Visit: Payer: Managed Care, Other (non HMO) | Admitting: Family Medicine

## 2024-05-20 ENCOUNTER — Ambulatory Visit: Admitting: Family Medicine

## 2024-08-15 ENCOUNTER — Ambulatory Visit: Admitting: Family Medicine
